# Patient Record
Sex: Female | Born: 1955
Health system: Southern US, Community
[De-identification: ages and names within clinical notes are randomized; demographics above are authoritative.]

## PROBLEM LIST (undated history)

## (undated) DIAGNOSIS — R87619 Unspecified abnormal cytological findings in specimens from cervix uteri: Secondary | ICD-10-CM

## (undated) DIAGNOSIS — A630 Anogenital (venereal) warts: Secondary | ICD-10-CM

## (undated) DIAGNOSIS — M419 Scoliosis, unspecified: Secondary | ICD-10-CM

## (undated) DIAGNOSIS — N200 Calculus of kidney: Secondary | ICD-10-CM

## (undated) DIAGNOSIS — H409 Unspecified glaucoma: Secondary | ICD-10-CM

## (undated) DIAGNOSIS — A64 Unspecified sexually transmitted disease: Secondary | ICD-10-CM

## (undated) DIAGNOSIS — K5790 Diverticulosis of intestine, part unspecified, without perforation or abscess without bleeding: Secondary | ICD-10-CM

## (undated) DIAGNOSIS — M810 Age-related osteoporosis without current pathological fracture: Secondary | ICD-10-CM

## (undated) DIAGNOSIS — H269 Unspecified cataract: Secondary | ICD-10-CM

## (undated) DIAGNOSIS — M8580 Other specified disorders of bone density and structure, unspecified site: Secondary | ICD-10-CM

## (undated) DIAGNOSIS — M1990 Unspecified osteoarthritis, unspecified site: Secondary | ICD-10-CM

## (undated) HISTORY — DX: Other specified disorders of bone density and structure, unspecified site: M85.80

## (undated) HISTORY — DX: Age-related osteoporosis without current pathological fracture: M81.0

## (undated) HISTORY — DX: Unspecified cataract: H26.9

## (undated) HISTORY — DX: Unspecified sexually transmitted disease: A64

## (undated) HISTORY — DX: Calculus of kidney: N20.0

## (undated) HISTORY — DX: Diverticulosis of intestine, part unspecified, without perforation or abscess without bleeding: K57.90

## (undated) HISTORY — DX: Unspecified abnormal cytological findings in specimens from cervix uteri: R87.619

## (undated) HISTORY — DX: Unspecified glaucoma: H40.9

## (undated) HISTORY — DX: Anogenital (venereal) warts: A63.0

## (undated) HISTORY — DX: Scoliosis, unspecified: M41.9

## (undated) HISTORY — DX: Unspecified osteoarthritis, unspecified site: M19.90

---

## 1982-07-01 DIAGNOSIS — A630 Anogenital (venereal) warts: Secondary | ICD-10-CM

## 1982-07-01 HISTORY — DX: Anogenital (venereal) warts: A63.0

## 1993-07-01 HISTORY — PX: PELVIC LAPAROSCOPY: SHX162

## 1995-07-02 HISTORY — PX: GYNECOLOGIC CRYOSURGERY: SHX857

## 2005-03-18 ENCOUNTER — Other Ambulatory Visit: Admission: RE | Admit: 2005-03-18 | Discharge: 2005-03-18 | Payer: Self-pay | Admitting: Obstetrics and Gynecology

## 2005-07-29 ENCOUNTER — Other Ambulatory Visit: Admission: RE | Admit: 2005-07-29 | Discharge: 2005-07-29 | Payer: Self-pay | Admitting: Obstetrics and Gynecology

## 2005-09-05 ENCOUNTER — Encounter: Admission: RE | Admit: 2005-09-05 | Discharge: 2005-09-05 | Payer: Self-pay | Admitting: Obstetrics and Gynecology

## 2006-02-12 ENCOUNTER — Other Ambulatory Visit: Admission: RE | Admit: 2006-02-12 | Discharge: 2006-02-12 | Payer: Self-pay | Admitting: Obstetrics and Gynecology

## 2006-09-08 ENCOUNTER — Encounter: Admission: RE | Admit: 2006-09-08 | Discharge: 2006-09-08 | Payer: Self-pay | Admitting: Obstetrics and Gynecology

## 2007-09-10 ENCOUNTER — Encounter: Admission: RE | Admit: 2007-09-10 | Discharge: 2007-09-10 | Payer: Self-pay | Admitting: Obstetrics and Gynecology

## 2007-12-28 ENCOUNTER — Encounter: Admission: RE | Admit: 2007-12-28 | Discharge: 2007-12-28 | Payer: Self-pay | Admitting: Obstetrics and Gynecology

## 2008-07-21 ENCOUNTER — Ambulatory Visit: Payer: Self-pay | Admitting: Sports Medicine

## 2008-07-21 DIAGNOSIS — IMO0002 Reserved for concepts with insufficient information to code with codable children: Secondary | ICD-10-CM | POA: Insufficient documentation

## 2008-07-21 DIAGNOSIS — M216X9 Other acquired deformities of unspecified foot: Secondary | ICD-10-CM | POA: Insufficient documentation

## 2008-07-21 DIAGNOSIS — M2170 Unequal limb length (acquired), unspecified site: Secondary | ICD-10-CM | POA: Insufficient documentation

## 2008-09-12 ENCOUNTER — Encounter: Admission: RE | Admit: 2008-09-12 | Discharge: 2008-09-12 | Payer: Self-pay | Admitting: Obstetrics and Gynecology

## 2009-09-13 ENCOUNTER — Encounter: Admission: RE | Admit: 2009-09-13 | Discharge: 2009-09-13 | Payer: Self-pay | Admitting: Obstetrics and Gynecology

## 2010-07-12 ENCOUNTER — Ambulatory Visit
Admission: RE | Admit: 2010-07-12 | Discharge: 2010-07-12 | Payer: Self-pay | Source: Home / Self Care | Attending: Sports Medicine | Admitting: Sports Medicine

## 2010-07-12 DIAGNOSIS — R269 Unspecified abnormalities of gait and mobility: Secondary | ICD-10-CM | POA: Insufficient documentation

## 2010-07-12 DIAGNOSIS — M7750 Other enthesopathy of unspecified foot: Secondary | ICD-10-CM | POA: Insufficient documentation

## 2010-08-02 NOTE — Assessment & Plan Note (Signed)
Summary: LT FOOT ARCH PAIN,MC   Vital Signs:  Patient profile:   55 year old female Height:      66 inches Weight:      140 pounds BMI:     22.68 Pulse rate:   60 / minute BP sitting:   115 / 79  (right arm)  Vitals Entered By: Rochele Pages, RN CC: L foot pain x 3 months- metatarsals   CC:  L foot pain x 3 months- metatarsals.  History of Present Illness: Pt presents to clinic for eval of Left foot pain- metatarsals plantar aspect x 3 months.  Better due to wearing supportive shoes such as danskos, worse with walking.   Has stopped running due to pain, now walking and riding recumbent bike for exercise.   Did well with previous intervention for shin splints, and leg length discrepency.  these were OTC sporst insoles with leg lift and MT pads these no longer control  her pain  Preventive Screening-Counseling & Management  Alcohol-Tobacco     Smoking Status: never  Allergies: No Known Drug Allergies  Social History: Smoking Status:  never  Physical Exam  General:  Well-developed,well-nourished,in no acute distress; alert,appropriate and cooperative throughout examination Msk:  Standing- widening of forefoot bilat Splaying between 2nd and 3rd toe on L.  Small bunionettes bilat Lt leg shorter than rt by 1.75 cm  gait shows trendelenburg to left and drop of shoulder good running form    Impression & Recommendations:  Problem # 1:  CAVUS DEFORMITY OF FOOT, ACQUIRED (ICD-736.73)  Patient was fitted for a : standard, cushioned, semi-rigid orthotic. The orthotic was heated and afterward the patient stood on the orthotic blank positioned on the orthotic stand. The patient was positioned in subtalar neutral position and 10 degrees of ankle dorsiflexion in a weight bearing stance. After completion of molding, a stable base was applied to the orthotic blank. The blank was ground to a stable position for weight bearing. Size: 9 Base: Blue Swirl Posting: Blue EVA, and Engineer, site on left for leg length discrepency Additional orthotic padding: bilateral medium metatarsal pads  Orders: Games developer, each unit ([id number])  Problem # 2:  ABNORMALITY OF GAIT (ICD-781.2)  with orthotic correction the trendelenburg  normalizes and shoulders are now level  Orders: Orthotic Materials, each unit (L3002)  Problem # 3:  UNEQUAL LEG LENGTH (ICD-736.81)  we added 2nd EVA lift ot left insole to correct this  use these as long as helping sxs  return prn  Orders: Orthotic Materials, each unit (L3002)  Problem # 4:  METATARSALGIA (ICD-726.70)  MT pads added to both inserts  Orders: Orthotic Materials, each unit [phone])  Patient Instructions: 1)  Chiropractors - Zenaida Deed 2)  Dameon Rudolfo   Orders Added: 1)  Est. Patient Level IV [03474] 2)  Orthotic Materials, each unit [L3002]

## 2010-08-14 ENCOUNTER — Other Ambulatory Visit: Payer: Self-pay | Admitting: Obstetrics and Gynecology

## 2010-08-14 DIAGNOSIS — Z1239 Encounter for other screening for malignant neoplasm of breast: Secondary | ICD-10-CM

## 2010-09-12 ENCOUNTER — Ambulatory Visit: Payer: Self-pay | Admitting: Sports Medicine

## 2010-09-17 ENCOUNTER — Ambulatory Visit
Admission: RE | Admit: 2010-09-17 | Discharge: 2010-09-17 | Disposition: A | Discharge status: Home / Self Care | Payer: Federal, State, Local not specified - PPO | Source: Ambulatory Visit | Attending: Obstetrics and Gynecology | Admitting: Obstetrics and Gynecology

## 2010-09-17 DIAGNOSIS — Z1239 Encounter for other screening for malignant neoplasm of breast: Secondary | ICD-10-CM

## 2011-08-22 ENCOUNTER — Other Ambulatory Visit: Payer: Self-pay | Admitting: Obstetrics and Gynecology

## 2011-08-22 DIAGNOSIS — Z1231 Encounter for screening mammogram for malignant neoplasm of breast: Secondary | ICD-10-CM

## 2011-09-03 ENCOUNTER — Ambulatory Visit (INDEPENDENT_AMBULATORY_CARE_PROVIDER_SITE_OTHER): Payer: Federal, State, Local not specified - PPO | Admitting: Obstetrics and Gynecology

## 2011-09-03 DIAGNOSIS — K5901 Slow transit constipation: Secondary | ICD-10-CM

## 2011-09-03 DIAGNOSIS — Z01419 Encounter for gynecological examination (general) (routine) without abnormal findings: Secondary | ICD-10-CM

## 2011-09-18 ENCOUNTER — Ambulatory Visit
Admission: RE | Admit: 2011-09-18 | Discharge: 2011-09-18 | Disposition: A | Discharge status: Home / Self Care | Payer: Federal, State, Local not specified - PPO | Source: Ambulatory Visit | Attending: Obstetrics and Gynecology | Admitting: Obstetrics and Gynecology

## 2011-09-18 DIAGNOSIS — Z1231 Encounter for screening mammogram for malignant neoplasm of breast: Secondary | ICD-10-CM

## 2011-09-23 ENCOUNTER — Encounter (INDEPENDENT_AMBULATORY_CARE_PROVIDER_SITE_OTHER): Payer: Federal, State, Local not specified - PPO | Admitting: Obstetrics and Gynecology

## 2011-09-23 ENCOUNTER — Encounter (INDEPENDENT_AMBULATORY_CARE_PROVIDER_SITE_OTHER): Payer: Federal, State, Local not specified - PPO

## 2011-09-23 ENCOUNTER — Other Ambulatory Visit: Payer: Self-pay | Admitting: Obstetrics and Gynecology

## 2011-09-23 DIAGNOSIS — N949 Unspecified condition associated with female genital organs and menstrual cycle: Secondary | ICD-10-CM

## 2011-09-23 DIAGNOSIS — R102 Pelvic and perineal pain: Secondary | ICD-10-CM

## 2011-09-26 ENCOUNTER — Ambulatory Visit
Admission: RE | Admit: 2011-09-26 | Discharge: 2011-09-26 | Disposition: A | Discharge status: Home / Self Care | Payer: Federal, State, Local not specified - PPO | Source: Ambulatory Visit | Attending: Obstetrics and Gynecology | Admitting: Obstetrics and Gynecology

## 2011-09-26 DIAGNOSIS — R102 Pelvic and perineal pain: Secondary | ICD-10-CM

## 2011-09-26 MED ORDER — GADOBENATE DIMEGLUMINE 529 MG/ML IV SOLN
13.0000 mL | Freq: Once | INTRAVENOUS | Status: AC | PRN
  Administered 2011-09-26: 13 mL via INTRAVENOUS

## 2011-09-30 ENCOUNTER — Encounter (INDEPENDENT_AMBULATORY_CARE_PROVIDER_SITE_OTHER): Payer: Federal, State, Local not specified - PPO | Admitting: Obstetrics and Gynecology

## 2011-09-30 DIAGNOSIS — N949 Unspecified condition associated with female genital organs and menstrual cycle: Secondary | ICD-10-CM

## 2012-08-21 ENCOUNTER — Other Ambulatory Visit: Payer: Self-pay | Admitting: Obstetrics and Gynecology

## 2012-08-21 DIAGNOSIS — Z1231 Encounter for screening mammogram for malignant neoplasm of breast: Secondary | ICD-10-CM

## 2012-09-21 ENCOUNTER — Ambulatory Visit: Payer: Federal, State, Local not specified - PPO

## 2012-11-25 ENCOUNTER — Ambulatory Visit
Admission: RE | Admit: 2012-11-25 | Discharge: 2012-11-25 | Disposition: A | Discharge status: Home / Self Care | Payer: Federal, State, Local not specified - PPO | Source: Ambulatory Visit | Attending: Obstetrics and Gynecology | Admitting: Obstetrics and Gynecology

## 2012-11-25 DIAGNOSIS — Z1231 Encounter for screening mammogram for malignant neoplasm of breast: Secondary | ICD-10-CM

## 2013-07-01 HISTORY — PX: MANDIBLE SURGERY: SHX707

## 2013-10-25 ENCOUNTER — Other Ambulatory Visit: Payer: Self-pay

## 2013-10-25 DIAGNOSIS — Z1231 Encounter for screening mammogram for malignant neoplasm of breast: Secondary | ICD-10-CM

## 2013-11-29 ENCOUNTER — Ambulatory Visit
Admission: RE | Admit: 2013-11-29 | Discharge: 2013-11-29 | Disposition: A | Discharge status: Home / Self Care | Payer: Federal, State, Local not specified - PPO | Source: Ambulatory Visit

## 2013-11-29 ENCOUNTER — Encounter (INDEPENDENT_AMBULATORY_CARE_PROVIDER_SITE_OTHER): Payer: Self-pay

## 2013-11-29 DIAGNOSIS — Z1231 Encounter for screening mammogram for malignant neoplasm of breast: Secondary | ICD-10-CM

## 2013-11-30 ENCOUNTER — Other Ambulatory Visit: Payer: Self-pay | Admitting: Obstetrics and Gynecology

## 2013-11-30 DIAGNOSIS — R928 Other abnormal and inconclusive findings on diagnostic imaging of breast: Secondary | ICD-10-CM

## 2013-12-06 ENCOUNTER — Ambulatory Visit
Admission: RE | Admit: 2013-12-06 | Discharge: 2013-12-06 | Disposition: A | Discharge status: Home / Self Care | Payer: Federal, State, Local not specified - PPO | Source: Ambulatory Visit | Attending: Obstetrics and Gynecology | Admitting: Obstetrics and Gynecology

## 2013-12-06 ENCOUNTER — Other Ambulatory Visit: Payer: Self-pay | Admitting: Obstetrics and Gynecology

## 2013-12-06 DIAGNOSIS — R928 Other abnormal and inconclusive findings on diagnostic imaging of breast: Secondary | ICD-10-CM

## 2014-09-02 ENCOUNTER — Other Ambulatory Visit: Payer: Self-pay | Admitting: Obstetrics and Gynecology

## 2014-09-02 DIAGNOSIS — N644 Mastodynia: Secondary | ICD-10-CM

## 2014-09-05 ENCOUNTER — Ambulatory Visit
Admission: RE | Admit: 2014-09-05 | Discharge: 2014-09-05 | Disposition: A | Discharge status: Home / Self Care | Payer: Federal, State, Local not specified - PPO | Source: Ambulatory Visit | Attending: Obstetrics and Gynecology | Admitting: Obstetrics and Gynecology

## 2014-09-05 ENCOUNTER — Other Ambulatory Visit: Payer: Self-pay | Admitting: Obstetrics and Gynecology

## 2014-09-05 DIAGNOSIS — N644 Mastodynia: Secondary | ICD-10-CM

## 2014-09-05 DIAGNOSIS — R0789 Other chest pain: Secondary | ICD-10-CM

## 2014-11-10 ENCOUNTER — Other Ambulatory Visit: Payer: Self-pay

## 2014-11-10 DIAGNOSIS — Z1231 Encounter for screening mammogram for malignant neoplasm of breast: Secondary | ICD-10-CM

## 2014-12-21 ENCOUNTER — Ambulatory Visit
Admission: RE | Admit: 2014-12-21 | Discharge: 2014-12-21 | Disposition: A | Discharge status: Home / Self Care | Payer: Federal, State, Local not specified - PPO | Source: Ambulatory Visit

## 2014-12-21 DIAGNOSIS — Z1231 Encounter for screening mammogram for malignant neoplasm of breast: Secondary | ICD-10-CM

## 2015-02-06 ENCOUNTER — Encounter: Payer: Self-pay | Admitting: Obstetrics and Gynecology

## 2015-02-06 ENCOUNTER — Ambulatory Visit (INDEPENDENT_AMBULATORY_CARE_PROVIDER_SITE_OTHER): Payer: Federal, State, Local not specified - PPO | Admitting: Obstetrics and Gynecology

## 2015-02-06 VITALS — BP 110/78 | HR 60 | Resp 16 | Ht 65.5 in | Wt 149.0 lb

## 2015-02-06 DIAGNOSIS — Z01419 Encounter for gynecological examination (general) (routine) without abnormal findings: Secondary | ICD-10-CM

## 2015-02-06 DIAGNOSIS — Z0000 Encounter for general adult medical examination without abnormal findings: Secondary | ICD-10-CM

## 2015-02-06 LAB — POCT URINALYSIS DIPSTICK
BILIRUBIN UA: NEGATIVE
Glucose, UA: NEGATIVE
Ketones, UA: NEGATIVE
LEUKOCYTES UA: NEGATIVE
Nitrite, UA: NEGATIVE
PROTEIN UA: NEGATIVE
RBC UA: NEGATIVE
Urobilinogen, UA: NEGATIVE
pH, UA: 5

## 2015-02-06 NOTE — Patient Instructions (Signed)

## 2015-02-06 NOTE — Progress Notes (Signed)
Patient ID: Brenda Dyer, female   DOB: Dec 14, 1955, 59 y.o.   MRN: [id number] 59 y.o. G74P1001 Married Caucasian female here for annual exam.    Decreased libido.  OK with this.   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 57 year old son.  PCP:   Darcus Austin, MD  No LMP recorded. Patient is postmenopausal.          Sexually active: Yes.  female  The current method of family planning is post menopausal status.   LMP was about 2009.  Exercising: Yes.    walking. Smoker:  no  Health Maintenance: Pap:  11/2013 normal per pt. History of abnormal Pap:  Yes,Hx of colposcopy/cryotherapy to cervix 1997 in [place].  Pt. Also states hx of dysplastic paps from 2006-2009 with colposcopies but no treatment to cervix.  States history of positive HR HPV. MMG:  12-21-14 Density Cat.D/neg:[facility] Colonoscopy:  2009 normal with Dr. Collene Mares.  Next due 2019. BMD:   About 5 years ago - Normal. TDaP:  Up to date with PCP Screening Labs:  Hb today: PCP, Urine today: Neg   reports that she has never smoked. She does not have any smokeless tobacco history on file. She reports that she drinks about 2.4 oz of alcohol per week. She reports that she does not use illicit drugs.  Past Medical History  Diagnosis Date  . Genital warts 1984  . STD (sexually transmitted disease)     Hx condyloma  . Abnormal Pap smear of cervix 1997, 2006-2009    --1997 pt. had colpo/cryotherapy to cervix in [place], [place], 2006-2009 abn.paps(dysplasia) and colposcopies but no treatment to cervix--paps returned to normal    Past Surgical History  Procedure Laterality Date  . Pelvic laparoscopy  1995    d/t back pain--nl--in [place], [place]  . Gynecologic cryosurgery  1997    done in [place], [place]  . Cesarean section  1991    Current Outpatient Prescriptions  Medication Sig Dispense Refill  . hyoscyamine (LEVSIN SL) 0.125 MG SL tablet Take 1 tablet by mouth as needed.     No  current facility-administered medications for this visit.    Family History  Problem Relation Age of Onset  . Heart attack Father   . Diabetes Father   . Other Father     vascular/cluster H/As  . Stroke Father   . Hyperlipidemia Brother     ROS:  Pertinent items are noted in HPI.  Otherwise, a comprehensive ROS was negative.  Exam:   BP 110/78 mmHg  Pulse 60  Resp 16  Ht 5' 5.5" (1.664 m)  Wt 149 lb (67.586 kg)  BMI 24.41 kg/m2    General appearance: alert, cooperative and appears stated age Head: Normocephalic, without obvious abnormality, atraumatic Neck: no adenopathy, supple, symmetrical, trachea midline and thyroid normal to inspection and palpation Lungs: clear to auscultation bilaterally Breasts: normal appearance, no masses or tenderness, Inspection negative, No nipple retraction or dimpling, No nipple discharge or bleeding, No axillary or supraclavicular adenopathy Heart: regular rate and rhythm Abdomen: soft, non-tender; bowel sounds normal; no masses,  no organomegaly Extremities: extremities normal, atraumatic, no cyanosis or edema Skin: Skin color, texture, turgor normal. No rashes or lesions Lymph nodes: Cervical, supraclavicular, and axillary nodes normal. No abnormal inguinal nodes palpated Neurologic: Grossly normal  Pelvic: External genitalia:  no lesions              Urethra:  normal appearing urethra  with no masses, tenderness or lesions              Bartholins and Skenes: normal                 Vagina: normal appearing vagina with normal color and discharge, no lesions              Cervix: no lesions and bleeds slightly with pap.              Pap taken: Yes.   Bimanual Exam:  Uterus:  normal size, contour, position, consistency, mobility, non-tender              Adnexa: normal adnexa and no mass, fullness, tenderness              Rectovaginal: Yes.  .  Confirms.              Anus:  normal sphincter tone, no lesions  Chaperone was present for  exam.  Assessment:   Well woman visit with normal exam. Remote hx of abnormal pap, condyloma, positive HR HPV and cryotherapy. Hx C/S.  Plan: Yearly mammogram recommended after age 59.  Recommended 3D. Recommended self breast exam.  Pap and HR HPV as above. Discussed Calcium, Vitamin D, regular exercise program including cardiovascular and weight bearing exercise. Labs performed.  No..    Refills given on medications.  No..    Follow up annually and prn.      After visit summary provided.

## 2015-02-09 LAB — IPS PAP TEST WITH HPV

## 2015-11-03 ENCOUNTER — Other Ambulatory Visit: Payer: Self-pay

## 2015-11-13 ENCOUNTER — Other Ambulatory Visit: Payer: Self-pay

## 2015-11-13 DIAGNOSIS — Z1231 Encounter for screening mammogram for malignant neoplasm of breast: Secondary | ICD-10-CM

## 2015-12-22 ENCOUNTER — Ambulatory Visit: Payer: Federal, State, Local not specified - PPO

## 2015-12-26 ENCOUNTER — Ambulatory Visit
Admission: RE | Admit: 2015-12-26 | Discharge: 2015-12-26 | Disposition: A | Discharge status: Home / Self Care | Payer: Federal, State, Local not specified - PPO | Source: Ambulatory Visit

## 2015-12-26 DIAGNOSIS — Z1231 Encounter for screening mammogram for malignant neoplasm of breast: Secondary | ICD-10-CM

## 2016-02-19 NOTE — Progress Notes (Signed)
60 y.o. G58P1001 Married Caucasian female here for annual exam.    Left breast/chest wall sensation doe 2 years. Has had normal diagnostic mammograms.  Pain does not radiate into her back.  It can radiate into her left arm and up into her jaw.  Had a normal EKG when the pain started first occurring.  Hx scoliosis.  Has seen a spine specialist.   Wants reassurance that it is OK.  Went on  [place] cruise. Retiring in a few months.   Labs with PCP - al normal.  PCP:   Darcus Austin, MD  Patient's last menstrual period was 07/02/2007 (approximate).           Sexually active: Yes.   female The current method of family planning is post menopausal status.    Exercising: Yes.    Walking and strength conditioning Smoker:  no  Health Maintenance: Pap:  02-06-15 Neg:Neg HR HPV History of abnormal Pap:  Yes, Hx of colposcopy/cryotherapy to cervix 1997 in [place].  Pt. Also states hx of dysplastic paps from 2006-2009 with colposcopies but no treatment to cervix.  States history of positive HR HPV. MMG: 12-29-15 Density C/Stable benign mass Rt.Br.12 o'clock--benign cyst demonstrated on U/S 12-06-13;Lt.Br.neg/screening 45yr/[facility] Colonoscopy:  2009 normal with Dr. Lenise Herald due 2019. BMD:   2011  Result:  normal TDaP:  PCP Gardasil:   N/A HIV:  Not done in past. Hep C:  Not done in past. Screening Labs:  Hb today: PCP, Urine today: Neg   reports that she has never smoked. She has never used smokeless tobacco. She reports that she drinks about 1.2 oz of alcohol per week . She reports that she does not use drugs.  Past Medical History:  Diagnosis Date  . Abnormal Pap smear of cervix 1997, 2006-2009   --1997 pt. had colpo/cryotherapy to cervix in [place], [place], 2006-2009 abn.paps(dysplasia) and colposcopies but no treatment to cervix--paps returned to normal  . Genital warts 1984  . STD (sexually transmitted disease)    Hx condyloma    Past Surgical History:  Procedure  Laterality Date  . CESAREAN SECTION  1991  . GYNECOLOGIC CRYOSURGERY  1997   done in [place], [place]   d/t back pain--nl--in [place], [place]    No current outpatient prescriptions on file.   No current facility-administered medications for this visit.     Family History  Problem Relation Age of Onset  . Heart attack Father   . Diabetes Father   . Other Father     vascular/cluster H/As  . Stroke Father   . Hyperlipidemia Brother     ROS:  Pertinent items are noted in HPI.  Otherwise, a comprehensive ROS was negative.  Exam:   BP 118/70 (BP Location: Right Arm, Patient Position: Sitting, Cuff Size: Normal)   Pulse 66   Resp 14   Ht 5' 5.5" (1.664 m)   Wt 139 lb (63 kg)   LMP 07/02/2007 (Approximate)   BMI 22.78 kg/m     General appearance: alert, cooperative and appears stated age Head: Normocephalic, without obvious abnormality, atraumatic Neck: no adenopathy, supple, symmetrical, trachea midline and thyroid normal to inspection and palpation Lungs: clear to auscultation bilaterally Breasts: normal appearance, no masses or tenderness, No nipple retraction or dimpling, No nipple discharge or bleeding, No axillary or supraclavicular adenopathy Heart: regular rate and rhythm Abdomen: soft, non-tender; no masses, no organomegaly Extremities: extremities normal, atraumatic, no cyanosis or edema Skin: Skin color, texture,  turgor normal. No rashes or lesions Lymph nodes: Cervical, supraclavicular, and axillary nodes normal. No abnormal inguinal nodes palpated Neurologic: Grossly normal  Pelvic: External genitalia:  no lesions              Urethra:  normal appearing urethra with no masses, tenderness or lesions              Bartholins and Skenes: normal                 Vagina: normal appearing vagina with normal color and discharge, no lesions              Cervix: no lesions              Pap taken: No. Bimanual Exam:  Uterus:  normal size, contour,  position, consistency, mobility, non-tender              Adnexa: no mass, fullness, tenderness              Rectal exam: Yes.  .  Confirms.              Anus:  normal sphincter tone, no lesions  Chaperone was present for exam.  Assessment:   Well woman visit with normal exam. Remote hx of abnormal pap, condyloma, positive HR HPV and cryotherapy. Hx C/S. Chest wall pain.   Plan: Yearly mammogram recommended after age 68.  Recommended self breast exam.  Pap and HR HPV as above. Discussed Calcium, Vitamin D, regular exercise program including cardiovascular and weight bearing exercise. I recommend patient return to her PCP to discuss her chest wall pain.  Consider CT of chest.  Will return for Hep C and HIV testing.  Follow up annually and prn.       After visit summary provided.

## 2016-02-21 ENCOUNTER — Ambulatory Visit (INDEPENDENT_AMBULATORY_CARE_PROVIDER_SITE_OTHER): Payer: Federal, State, Local not specified - PPO | Admitting: Obstetrics and Gynecology

## 2016-02-21 ENCOUNTER — Encounter: Payer: Self-pay | Admitting: Obstetrics and Gynecology

## 2016-02-21 VITALS — BP 118/70 | HR 66 | Resp 14 | Ht 65.5 in | Wt 139.0 lb

## 2016-02-21 DIAGNOSIS — Z0000 Encounter for general adult medical examination without abnormal findings: Secondary | ICD-10-CM | POA: Diagnosis not present

## 2016-02-21 DIAGNOSIS — Z113 Encounter for screening for infections with a predominantly sexual mode of transmission: Secondary | ICD-10-CM | POA: Diagnosis not present

## 2016-02-21 DIAGNOSIS — Z01419 Encounter for gynecological examination (general) (routine) without abnormal findings: Secondary | ICD-10-CM | POA: Diagnosis not present

## 2016-02-21 LAB — POCT URINALYSIS DIPSTICK
BILIRUBIN UA: NEGATIVE
Blood, UA: NEGATIVE
GLUCOSE UA: NEGATIVE
Ketones, UA: NEGATIVE
LEUKOCYTES UA: NEGATIVE
Nitrite, UA: NEGATIVE
Protein, UA: NEGATIVE
Urobilinogen, UA: NEGATIVE
pH, UA: 5

## 2016-02-21 NOTE — Patient Instructions (Signed)

## 2016-02-22 ENCOUNTER — Other Ambulatory Visit (INDEPENDENT_AMBULATORY_CARE_PROVIDER_SITE_OTHER): Payer: Federal, State, Local not specified - PPO

## 2016-02-22 DIAGNOSIS — Z113 Encounter for screening for infections with a predominantly sexual mode of transmission: Secondary | ICD-10-CM

## 2016-02-23 LAB — HEPATITIS C ANTIBODY: HCV Ab: NEGATIVE

## 2016-02-23 LAB — HIV ANTIBODY (ROUTINE TESTING W REFLEX): HIV 1&2 Ab, 4th Generation: NONREACTIVE

## 2016-04-10 ENCOUNTER — Telehealth: Payer: Self-pay | Admitting: Obstetrics and Gynecology

## 2016-04-10 DIAGNOSIS — N644 Mastodynia: Secondary | ICD-10-CM

## 2016-04-10 NOTE — Telephone Encounter (Signed)
Spoke with patient. Patient requesting referral for diagnostic MMG of left breast. Patient states she has had ongoing left breast pain in "upper quadrant". Denies tenderness, discharge, redness. Patient states she discussed this at length with Dr. Quincy Simmonds at [facility] 02/21/16. Patient states now she would like the MMG done for piece of mind. Recommended patient come in for OV for further evaluation, patient declined. Patient states she was just in and could have had it done then. Advised I would review with Dr. Quincy Simmonds and return call with recommendations. Advised Dr. Quincy Simmonds is seeing patients, repsonse may not be immediate. Patient is agreeable. Last screening MMG 12/29/15.    Dr Quincy Simmonds, please advise?

## 2016-04-10 NOTE — Telephone Encounter (Signed)
Left detailed message, ok per current dpr. Advised as seen below per Dr. Quincy Simmonds. Order placed for MMG and [place]. Advised patient to call and schedule at [facility] [phone]. Call office for any additional questions/concerns.    Routing to provider for final review. Patient is agreeable to disposition. Will close encounter.

## 2016-04-10 NOTE — Telephone Encounter (Signed)
We did discuss her breast/chest wall pain at her visit.  OK for diagnostic left mammogram and ultrasound.   I really do recommend she see her PCP to discuss this pain and have them determine if she needs to do a CT of the chest also.   Thanks.

## 2016-04-10 NOTE — Telephone Encounter (Signed)
[facility] of the left breast.

## 2016-04-22 ENCOUNTER — Other Ambulatory Visit: Payer: Federal, State, Local not specified - PPO

## 2016-04-23 ENCOUNTER — Ambulatory Visit
Admission: RE | Admit: 2016-04-23 | Discharge: 2016-04-23 | Disposition: A | Discharge status: Home / Self Care | Payer: Federal, State, Local not specified - PPO | Source: Ambulatory Visit | Attending: Obstetrics and Gynecology | Admitting: Obstetrics and Gynecology

## 2016-04-23 DIAGNOSIS — N644 Mastodynia: Secondary | ICD-10-CM

## 2016-11-11 ENCOUNTER — Other Ambulatory Visit: Payer: Self-pay | Admitting: Obstetrics and Gynecology

## 2016-11-11 DIAGNOSIS — Z1231 Encounter for screening mammogram for malignant neoplasm of breast: Secondary | ICD-10-CM

## 2016-12-26 ENCOUNTER — Ambulatory Visit
Admission: RE | Admit: 2016-12-26 | Discharge: 2016-12-26 | Disposition: A | Discharge status: Home / Self Care | Payer: Federal, State, Local not specified - PPO | Source: Ambulatory Visit | Attending: Obstetrics and Gynecology | Admitting: Obstetrics and Gynecology

## 2016-12-26 DIAGNOSIS — Z1231 Encounter for screening mammogram for malignant neoplasm of breast: Secondary | ICD-10-CM

## 2017-02-28 ENCOUNTER — Ambulatory Visit (INDEPENDENT_AMBULATORY_CARE_PROVIDER_SITE_OTHER): Payer: Federal, State, Local not specified - PPO | Admitting: Obstetrics and Gynecology

## 2017-02-28 ENCOUNTER — Encounter: Payer: Self-pay | Admitting: Obstetrics and Gynecology

## 2017-02-28 ENCOUNTER — Other Ambulatory Visit (HOSPITAL_COMMUNITY)
Admission: RE | Admit: 2017-02-28 | Discharge: 2017-02-28 | Disposition: A | Discharge status: Home / Self Care | Payer: Federal, State, Local not specified - PPO | Source: Ambulatory Visit | Attending: Obstetrics and Gynecology | Admitting: Obstetrics and Gynecology

## 2017-02-28 VITALS — BP 102/60 | HR 64 | Resp 16 | Ht 65.25 in | Wt 144.0 lb

## 2017-02-28 DIAGNOSIS — Z01419 Encounter for gynecological examination (general) (routine) without abnormal findings: Secondary | ICD-10-CM | POA: Insufficient documentation

## 2017-02-28 NOTE — Progress Notes (Signed)
61 y.o. G83P1001 Married Caucasian female here for annual exam.    No bleeding or spotting.   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   Retired in December.   Exercising every day.  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  PCP:  Dr. Inda Merlin [facility] - sees another provider but cannot remember the person's name.  Patient's last menstrual period was 07/02/2007 (approximate).           Sexually active: No.  The current method of family planning is post menopausal status.    Exercising: Yes.    yoga and cardio Smoker:  no   Health Maintenance: Pap:  02-06-15 Neg:Neg HR HPV History of abnormal Pap:  Yes, Hx of colposcopy/cryotherapy to cervix 1997 in [place]. Pt. Also states hx of dysplastic paps from 2006-2009 with colposcopies but no treatment to cervix. States history of positive HR HPV. MMG:  12/26/16 BIRADS 1 negative/density c Colonoscopy:  2009 normal with Dr. Lenise Herald due 2019 BMD:   2011  Result  normal TDaP: up to date with PCP Gardasil:   n/a HIV and Hep C: 02/22/16 Negative Screening Labs:  PCP takes care of labs   reports that she has never smoked. She has never used smokeless tobacco. She reports that she drinks about 1.2 oz of alcohol per week . She reports that she does not use drugs.  Past Medical History:  Diagnosis Date  . Abnormal Pap smear of cervix 1997, 2006-2009   --1997 pt. had colpo/cryotherapy to cervix in [place], [place], 2006-2009 abn.paps(dysplasia) and colposcopies but no treatment to cervix--paps returned to normal  . Genital warts 1984  . STD (sexually transmitted disease)    Hx condyloma    Past Surgical History:  Procedure Laterality Date  . BREAST BIOPSY Left   . CESAREAN SECTION  1991  . GYNECOLOGIC CRYOSURGERY  1997   done in [place], [place]   d/t back pain--nl--in [place], [place]    Current Outpatient Prescriptions  Medication Sig Dispense Refill  . Cholecalciferol (VITAMIN D)  2000 units CAPS daily.    Mariane Baumgarten Calcium (STOOL SOFTENER PO) Take by mouth.    Marland Kitchen olopatadine (PATANOL) 0.1 % ophthalmic solution as needed.     No current facility-administered medications for this visit.     Family History  Problem Relation Age of Onset  . Heart attack Father   . Diabetes Father   . Other Father        vascular/cluster H/As  . Stroke Father   . Hyperlipidemia Brother   . Breast cancer Neg Hx     ROS:  Pertinent items are noted in HPI.  Otherwise, a comprehensive ROS was negative.  Exam:   BP 102/60 (BP Location: Right Arm, Patient Position: Sitting, Cuff Size: Normal)   Pulse 64   Resp 16   Ht 5' 5.25" (1.657 m)   Wt 144 lb (65.3 kg)   LMP 07/02/2007 (Approximate)   BMI 23.78 kg/m     General appearance: alert, cooperative and appears stated age Head: Normocephalic, without obvious abnormality, atraumatic Neck: no adenopathy, supple, symmetrical, trachea midline and thyroid normal to inspection and palpation Lungs: clear to auscultation bilaterally Breasts: normal appearance, no masses or tenderness, No nipple retraction or dimpling, No nipple discharge or bleeding, No axillary or supraclavicular adenopathy Heart: regular rate and rhythm Abdomen: soft, non-tender; no masses, no organomegaly Extremities: extremities normal, atraumatic, no cyanosis or edema Skin: Skin color,  texture, turgor normal. No rashes or lesions Lymph nodes: supraclavicular, and axillary nodes normal.  1 cm right posterior cervical node, nontender. No abnormal inguinal nodes palpated Neurologic: Grossly normal  Pelvic: External genitalia:  no lesions              Urethra:  normal appearing urethra with no masses, tenderness or lesions              Bartholins and Skenes: normal                 Vagina: normal appearing vagina with normal color and discharge, no lesions              Cervix:  Small nabothian cyst 3 mm in central cervical area?              Pap taken: Yes.    Bimanual Exam:  Uterus:  normal size, contour, position, consistency, mobility, non-tender              Adnexa: no mass, fullness, tenderness              Rectal exam: Yes.  .  Confirms.              Anus:  normal sphincter tone, no lesions  Chaperone was present for exam.  Assessment:   Well woman visit with normal exam. Possible nabothian cyst of the cervix.  Remote hx of abnormal pap.  Palpable right cervical node.   Plan: Mammogram screening discussed. Recommended self breast awareness. Pap and HR HPV as above. Guidelines for Calcium, Vitamin D, regular exercise program including cardiovascular and weight bearing exercise. Patient will monitor node and call back if it increases or notes other enlarged nodes.   Follow up annually and prn.   After visit summary provided.

## 2017-02-28 NOTE — Patient Instructions (Signed)

## 2017-03-06 LAB — CYTOLOGY - PAP
Diagnosis: NEGATIVE
HPV: NOT DETECTED

## 2017-07-01 DIAGNOSIS — Z87442 Personal history of urinary calculi: Secondary | ICD-10-CM

## 2017-07-01 HISTORY — DX: Personal history of urinary calculi: Z87.442

## 2017-08-25 ENCOUNTER — Ambulatory Visit (INDEPENDENT_AMBULATORY_CARE_PROVIDER_SITE_OTHER): Payer: Federal, State, Local not specified - PPO | Admitting: Orthopaedic Surgery

## 2017-08-25 ENCOUNTER — Ambulatory Visit (INDEPENDENT_AMBULATORY_CARE_PROVIDER_SITE_OTHER): Payer: Federal, State, Local not specified - PPO

## 2017-08-25 ENCOUNTER — Encounter (INDEPENDENT_AMBULATORY_CARE_PROVIDER_SITE_OTHER): Payer: Self-pay | Admitting: Orthopaedic Surgery

## 2017-08-25 DIAGNOSIS — M25562 Pain in left knee: Secondary | ICD-10-CM | POA: Diagnosis not present

## 2017-08-25 DIAGNOSIS — M25561 Pain in right knee: Secondary | ICD-10-CM | POA: Diagnosis not present

## 2017-08-25 MED ORDER — DICLOFENAC SODIUM 1 % TD GEL: 2.0000 g | Freq: Four times a day (QID) | TRANSDERMAL | 5 refills | Status: AC

## 2017-08-25 NOTE — Progress Notes (Signed)
Office Visit Note   Patient: Brenda Dyer           Date of Birth: 02-05-1956           MRN: [id number] Visit Date: 08/25/2017              Requested by: Darcus Austin, MD [address], [place] [address] PCP: Darcus Austin, MD   Assessment & Plan: Visit Diagnoses:  1. Acute pain of both knees     Plan: Impression is knee peds anserine bursitis.  This is overall improving.  I recommend continued rest and ice and heat as needed.  Oral NSAIDs as needed.  Prescription for Voltaren gel was provided today.  Patient is decided going to.  The other possibility is stress reaction of the medial tibial plateau but overall she is doing okay and I did not feel compelled to order advanced imaging at this point.  She also does not have joint effusion.  Follow-Up Instructions: Return if symptoms worsen or fail to improve.   Orders:  Orders Placed This Encounter  Procedures  . XR KNEE 3 VIEW LEFT  . XR KNEE 3 VIEW RIGHT   Meds ordered this encounter  Medications  . diclofenac sodium (VOLTAREN) 1 % GEL    Sig: Apply 2 g topically 4 (four) times daily.    Dispense:  1 Tube    Refill:  5      Procedures: No procedures performed   Clinical Data: No additional findings.   Subjective: Chief Complaint  Patient presents with  . Left Knee - Pain  . Right Knee - Pain    Patient is a 62 year old female who recently began program about a months ago and then had onset of bilateral medial knee pain shortly thereafter.  She denies any injuries.  The pain has progressively gotten better over the last 2-3 weeks.  She is not done any running for about 2 weeks.  Denies any numbness and tingling.  Denies any mechanical symptoms.  She does endorse swelling over the peds anserine bursa.  Denies any joint swelling.    Review of Systems  Constitutional: Negative.   HENT: Negative.   Eyes: Negative.   Respiratory: Negative.   Cardiovascular: Negative.   Endocrine:  Negative.   Musculoskeletal: Negative.   Neurological: Negative.   Hematological: Negative.   Psychiatric/Behavioral: Negative.   All other systems reviewed and are negative.    Objective: Vital Signs: LMP 07/02/2007 (Approximate)   Physical Exam  Constitutional: She is oriented to person, place, and time. She appears well-developed and well-nourished.  Pulmonary/Chest: Effort normal.  Neurological: She is alert and oriented to person, place, and time.  Skin: Skin is warm. Capillary refill takes less than 2 seconds.  Psychiatric: She has a normal mood and affect. Her behavior is normal. Judgment and thought content normal.  Nursing note and vitals reviewed.   Ortho Exam Bilateral knee exam shows no joint effusion.  Collaterals and cruciates are stable.  No joint line tenderness.  She is tender over the peds anserine bursa. Specialty Comments:  No specialty comments available.  Imaging: Xr Knee 3 View Left  Result Date: 08/25/2017 No acute or structural abnormalities  Xr Knee 3 View Right  Result Date: 08/25/2017 No acute or structural abnormalities    PMFS History: Patient Active Problem List   Diagnosis Date Noted  . METATARSALGIA 07/12/2010  . ABNORMALITY OF GAIT 07/12/2010  . 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  UNEQUAL LEG LENGTH 07/21/2008  . SHIN SPLINTS 07/21/2008   Past Medical History:  Diagnosis Date  . Abnormal Pap smear of cervix 1997, 2006-2009   --1997 pt. had colpo/cryotherapy to cervix in [place], [place], 2006-2009 abn.paps(dysplasia) and colposcopies but no treatment to cervix--paps returned to normal  . Genital warts 1984  . STD (sexually transmitted disease)    Hx condyloma    Family History  Problem Relation Age of Onset  . Heart attack Father   . Diabetes Father   . Other Father        vascular/cluster H/As  . Stroke Father   . Hyperlipidemia Brother   . Breast cancer Neg Hx     Past Surgical History:  Procedure Laterality  Date  . BREAST BIOPSY Left   . CESAREAN SECTION  1991  . GYNECOLOGIC CRYOSURGERY  1997   done in [place], [place]   d/t back pain--nl--in [place], [place] History   Occupational History  . Not on file  Tobacco Use  . Smoking status: Never Smoker  . Smokeless tobacco: Never Used  Substance and Sexual Activity  . Alcohol use: Yes    Alcohol/week: 1.2 oz    Types: 2 Standard drinks or equivalent per week  . Drug use: No  . Sexual activity: Not Currently    Partners: Male    Birth control/protection: Post-menopausal

## 2017-10-04 ENCOUNTER — Emergency Department (HOSPITAL_COMMUNITY)
Admission: EM | Admit: 2017-10-04 | Discharge: 2017-10-05 | Disposition: A | Discharge status: Home / Self Care | Payer: Federal, State, Local not specified - PPO | Attending: Emergency Medicine | Admitting: Emergency Medicine

## 2017-10-04 ENCOUNTER — Other Ambulatory Visit: Payer: Self-pay

## 2017-10-04 ENCOUNTER — Encounter (HOSPITAL_COMMUNITY): Payer: Self-pay

## 2017-10-04 ENCOUNTER — Emergency Department (HOSPITAL_COMMUNITY): Payer: Federal, State, Local not specified - PPO

## 2017-10-04 DIAGNOSIS — R109 Unspecified abdominal pain: Secondary | ICD-10-CM

## 2017-10-04 DIAGNOSIS — R1031 Right lower quadrant pain: Secondary | ICD-10-CM | POA: Diagnosis not present

## 2017-10-04 DIAGNOSIS — R1030 Lower abdominal pain, unspecified: Secondary | ICD-10-CM | POA: Diagnosis present

## 2017-10-04 LAB — I-STAT CHEM 8, ED
BUN: 18 mg/dL (ref 6–20)
CHLORIDE: 103 mmol/L (ref 101–111)
Calcium, Ion: 1.1 mmol/L — ABNORMAL LOW (ref 1.15–1.40)
Creatinine, Ser: 1 mg/dL (ref 0.44–1.00)
GLUCOSE: 109 mg/dL — AB (ref 65–99)
HCT: 38 % (ref 36.0–46.0)
Hemoglobin: 12.9 g/dL (ref 12.0–15.0)
Potassium: 3.1 mmol/L — ABNORMAL LOW (ref 3.5–5.1)
Sodium: 140 mmol/L (ref 135–145)
TCO2: 23 mmol/L (ref 22–32)

## 2017-10-04 LAB — I-STAT BETA HCG BLOOD, ED (MC, WL, AP ONLY)

## 2017-10-04 MED ORDER — KETOROLAC TROMETHAMINE 30 MG/ML IJ SOLN
30.0000 mg | Freq: Once | INTRAMUSCULAR | Status: AC
  Administered 2017-10-04: 30 mg via INTRAVENOUS
  Filled 2017-10-04: qty 1

## 2017-10-04 MED ORDER — ONDANSETRON HCL 4 MG/2ML IJ SOLN
4.0000 mg | Freq: Once | INTRAMUSCULAR | Status: AC
  Administered 2017-10-04: 4 mg via INTRAVENOUS
  Filled 2017-10-04: qty 2

## 2017-10-04 MED ORDER — HYDROMORPHONE HCL 1 MG/ML IJ SOLN
1.0000 mg | Freq: Once | INTRAMUSCULAR | Status: AC
  Administered 2017-10-04: 1 mg via INTRAVENOUS
  Filled 2017-10-04: qty 1

## 2017-10-04 MED ORDER — SODIUM CHLORIDE 0.9 % IV BOLUS
1000.0000 mL | Freq: Once | INTRAVENOUS | Status: AC
  Administered 2017-10-04: 1000 mL via INTRAVENOUS

## 2017-10-04 NOTE — ED Provider Notes (Signed)
[facility] DEPT Provider Note   CSN: [id number] Arrival date & time: 10/04/17  2110    History   Chief Complaint Chief Complaint  Patient presents with  . Flank Pain    HPI Brenda Dyer is a 62 y.o. female.  62 year old female with no significant past medical history presents to the emergency department for evaluation of flank pain.  Symptoms began at [place] and originate in her right flank and radiate towards her right lower abdomen.  She has had vomiting and dry heaves associated with worsening pain which continues to wax and wane.  She denies any specific nausea.  No associated dysuria, hematuria, fevers.  Pain was sudden in onset and has been constant.  She denies a history of kidney stones.  Abdominal surgical history significant for cesarean section.  She was unable to take any medications prior to arrival secondary to vomiting.  The history is provided by the patient. No language interpreter was used.  Flank Pain     Past Medical History:  Diagnosis Date  . Abnormal Pap smear of cervix 1997, 2006-2009   --1997 pt. had colpo/cryotherapy to cervix in [place], [place], 2006-2009 abn.paps(dysplasia) and colposcopies but no treatment to cervix--paps returned to normal  . Genital warts 1984  . STD (sexually transmitted disease)    Hx condyloma    Patient Active Problem List   Diagnosis Date Noted  . METATARSALGIA 07/12/2010  . ABNORMALITY OF GAIT 07/12/2010  . 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 UNEQUAL LEG LENGTH 07/21/2008  . SHIN SPLINTS 07/21/2008    Past Surgical History:  Procedure Laterality Date  . CESAREAN SECTION  1991  . [facility]   done in [place], [place]   d/t back pain--nl--in [place], [place]     OB History    Gravida  1   Para  1   Term  1   Preterm      AB      Living  1     SAB      TAB      Ectopic      Multiple      Live Births               Home Medications    Prior to Admission medications   Medication Sig Start Date End Date Taking? Authorizing Provider  diclofenac sodium (VOLTAREN) 1 % GEL Apply 2 g topically 4 (four) times daily. Patient not taking: Reported on 10/05/2017 08/25/17   Leandrew Koyanagi, MD  ibuprofen (ADVIL,MOTRIN) 600 MG tablet Take 1 tablet (600 mg total) by mouth every 6 (six) hours as needed. 10/05/17   Antonietta Breach, PA-C  ondansetron (ZOFRAN ODT) 4 MG disintegrating tablet Take 1 tablet (4 mg total) by mouth every 6 (six) hours as needed for nausea or vomiting. 10/05/17   Antonietta Breach, PA-C  oxyCODONE-acetaminophen (PERCOCET/ROXICET) 5-325 MG tablet Take 1-2 tablets by mouth every 6 (six) hours as needed for severe pain. 10/05/17   Antonietta Breach, PA-C  tamsulosin (FLOMAX) 0.4 MG CAPS capsule Take 1 capsule (0.4 mg total) by mouth daily. 10/05/17   Antonietta Breach, PA-C    Family History Family History  Problem Relation Age of Onset  . Heart attack Father   . Diabetes Father   . Other Father        vascular/cluster H/As  . Stroke Father   . Hyperlipidemia Brother   . Breast cancer Neg  Hx     Social History Social History   Tobacco Use  . Smoking status: Never Smoker  . Smokeless tobacco: Never Used  Substance Use Topics  . Alcohol use: Yes    Alcohol/week: 1.2 oz    Types: 2 Standard drinks or equivalent per week  . Drug use: No     Allergies   Codeine   Review of Systems Review of Systems  Genitourinary: Positive for flank pain.  Ten systems reviewed and are negative for acute change, except as noted in the HPI.    Physical Exam Updated Vital Signs BP 116/63   Pulse 65   Temp 98 F (36.7 C) (Oral)   Resp 15   Ht 5\' 6"  (1.676 m)   Wt 64.9 kg (143 lb)   LMP 07/02/2007 (Approximate)   SpO2 98%   BMI 23.08 kg/m   Physical Exam  Constitutional: She is oriented to person, place, and time. She appears well-developed and well-nourished. No distress.  Nontoxic appearing. Dry heaving.  Uncomfortable.  HENT:  Head: Normocephalic and atraumatic.  Eyes: Conjunctivae and EOM are normal. No scleral icterus.  Neck: Normal range of motion.  Cardiovascular: Normal rate, regular rhythm and intact distal pulses.  Pulmonary/Chest: Effort normal. No stridor. No respiratory distress. She has no wheezes.  Respirations even and unlabored. Lungs CTAB.  Musculoskeletal: Normal range of motion.  Neurological: She is alert and oriented to person, place, and time. She exhibits normal muscle tone. Coordination normal.  Skin: Skin is warm and dry. No rash noted. She is not diaphoretic. No erythema. No pallor.  Psychiatric: She has a normal mood and affect. Her behavior is normal.  Nursing note and vitals reviewed.    ED Treatments / Results  Labs (all labs ordered are listed, but only abnormal results are displayed) Labs Reviewed  URINALYSIS, ROUTINE W REFLEX MICROSCOPIC - Abnormal; Notable for the following components:      Result Value   APPearance HAZY (*)    Hgb urine dipstick LARGE (*)    Ketones, ur 20 (*)    Protein, ur 100 (*)    Squamous Epithelial / LPF 0-5 (*)    All other components within normal limits  I-STAT CHEM 8, ED - Abnormal; Notable for the following components:   Potassium 3.1 (*)    Glucose, Bld 109 (*)    Calcium, Ion 1.10 (*)    All other components within normal limits  I-STAT BETA HCG BLOOD, ED ([facility], [facility], [facility] ONLY)    EKG None  Radiology Ct Renal Stone Study  Result Date: 10/05/2017 CLINICAL DATA:  Right flank pain, nausea, and vomiting. EXAM: CT ABDOMEN AND PELVIS WITHOUT CONTRAST TECHNIQUE: Multidetector CT imaging of the abdomen and pelvis was performed following the standard protocol without IV contrast. COMPARISON:  MRI pelvis 09/26/2011 FINDINGS: Lower chest: Atelectasis in the lung bases. Small esophageal hiatal hernia. Hepatobiliary: No focal liver abnormality is seen. No gallstones, gallbladder wall thickening, or biliary dilatation. Pancreas:  Unremarkable. No pancreatic ductal dilatation or surrounding inflammatory changes. Spleen: Normal in size without focal abnormality. Adrenals/Urinary Tract: No adrenal gland nodules. 3 mm stone in the right ureterovesical junction. Right hydronephrosis and hydroureter with stranding around the right kidney and ureter. Left kidney and ureter are unremarkable. Bladder wall is not thickened and no bladder filling defects are demonstrated. Stomach/Bowel: Stomach, small bowel, and colon are mostly decompressed, limiting evaluation. No wall thickening or inflammatory changes are appreciated. Diverticulosis of the sigmoid colon without evidence of diverticulitis. The  appendix is normal. Vascular/Lymphatic: No significant vascular findings are present. No enlarged abdominal or pelvic lymph nodes. Reproductive: Uterus and bilateral adnexa are unremarkable. Other: No abdominal wall hernia or abnormality. No abdominopelvic ascites. Musculoskeletal: No acute or significant osseous findings. IMPRESSION: 1. 3 mm stone in the distal right ureter with moderate proximal obstruction. 2. Small esophageal hiatal hernia. 3. Diverticulosis of the sigmoid colon without evidence of diverticulitis. Electronically Signed   By: Lucienne Capers M.D.   On: 10/05/2017 00:10    Procedures Procedures (including critical care time)  Medications Ordered in ED Medications  ketorolac (TORADOL) 30 MG/ML injection 30 mg (30 mg Intravenous Given 10/04/17 2217)  ondansetron (ZOFRAN) injection 4 mg (4 mg Intravenous Given 10/04/17 2217)  HYDROmorphone (DILAUDID) injection 1 mg (1 mg Intravenous Given 10/04/17 2217)  sodium chloride 0.9 % bolus 1,000 mL (0 mLs Intravenous Stopped 10/04/17 2355)  oxyCODONE-acetaminophen (PERCOCET/ROXICET) 5-325 MG per tablet 2 tablet (2 tablets Oral Given 10/05/17 0053)     Initial Impression / Assessment and Plan / ED Course  I have reviewed the triage vital signs and the nursing notes.  Pertinent labs & imaging  results that were available during my care of the patient were reviewed by me and considered in my medical decision making (see chart for details).     Pt has been diagnosed with a kidney stone via CT. There is no evidence of significant hydronephrosis, serum creatine WNL, vitals sign stable and the pt does not have irratractable vomiting. Patient will be discharged home with pain medications and has been advised to follow up with Urology as needed. Return precautions discussed and provided. Patient discharged in stable condition with no unaddressed concerns.   Final Clinical Impressions(s) / ED Diagnoses   Final diagnoses:  Right flank pain    ED Discharge Orders        Ordered    oxyCODONE-acetaminophen (PERCOCET/ROXICET) 5-325 MG tablet  Every 6 hours PRN     10/05/17 0148    tamsulosin (FLOMAX) 0.4 MG CAPS capsule  Daily     10/05/17 0148    ibuprofen (ADVIL,MOTRIN) 600 MG tablet  Every 6 hours PRN     10/05/17 0148    ondansetron (ZOFRAN ODT) 4 MG disintegrating tablet  Every 6 hours PRN     10/05/17 0148       Antonietta Breach, PA-C 10/05/17 0938    Lajean Saver, MD 10/05/17 1643

## 2017-10-04 NOTE — ED Triage Notes (Signed)
States about 1900 pm right flank pain voiced with nausea and vomiting voiced no hematuria voiced.

## 2017-10-05 LAB — URINALYSIS, ROUTINE W REFLEX MICROSCOPIC
BILIRUBIN URINE: NEGATIVE
Bacteria, UA: NONE SEEN
Glucose, UA: NEGATIVE mg/dL
Ketones, ur: 20 mg/dL — AB
LEUKOCYTES UA: NEGATIVE
Nitrite: NEGATIVE
PH: 7 (ref 5.0–8.0)
Protein, ur: 100 mg/dL — AB
Specific Gravity, Urine: 1.023 (ref 1.005–1.030)

## 2017-10-05 MED ORDER — TAMSULOSIN HCL 0.4 MG PO CAPS: 0.4000 mg | ORAL_CAPSULE | Freq: Every day | ORAL | 0 refills | Status: DC

## 2017-10-05 MED ORDER — ONDANSETRON 4 MG PO TBDP: 4.0000 mg | ORAL_TABLET | Freq: Four times a day (QID) | ORAL | 0 refills | Status: DC | PRN

## 2017-10-05 MED ORDER — IBUPROFEN 600 MG PO TABS: 600.0000 mg | ORAL_TABLET | Freq: Four times a day (QID) | ORAL | 0 refills | Status: DC | PRN

## 2017-10-05 MED ORDER — OXYCODONE-ACETAMINOPHEN 5-325 MG PO TABS
2.0000 | ORAL_TABLET | Freq: Once | ORAL | Status: AC
  Administered 2017-10-05: 2 via ORAL
  Filled 2017-10-05: qty 2

## 2017-10-05 MED ORDER — OXYCODONE-ACETAMINOPHEN 5-325 MG PO TABS: 1.0000 | ORAL_TABLET | Freq: Four times a day (QID) | ORAL | 0 refills | Status: DC | PRN

## 2017-10-05 NOTE — Discharge Instructions (Signed)
Your CT showed a 3 mm kidney stone on the right side which should pass into your bladder in the next few days.  We recommend the use of Percocet as prescribed for pain control, supplemented with ibuprofen.  You may take Flomax to promote stone movement and Zofran as needed for nausea and vomiting.  Be sure to drink plenty of fluids as well.  You may follow-up with a urologist if desired.  Return to the emergency department for new or concerning symptoms.

## 2017-12-04 ENCOUNTER — Other Ambulatory Visit: Payer: Self-pay | Admitting: Obstetrics and Gynecology

## 2017-12-04 DIAGNOSIS — Z1231 Encounter for screening mammogram for malignant neoplasm of breast: Secondary | ICD-10-CM

## 2017-12-22 ENCOUNTER — Telehealth: Payer: Self-pay | Admitting: Obstetrics and Gynecology

## 2017-12-22 DIAGNOSIS — K5900 Constipation, unspecified: Secondary | ICD-10-CM

## 2017-12-22 DIAGNOSIS — Z1211 Encounter for screening for malignant neoplasm of colon: Secondary | ICD-10-CM

## 2017-12-22 NOTE — Telephone Encounter (Signed)
Patient left voicemail requesting a suggestion for a gastroenterologist. She would like to switch to another female, if possible.

## 2017-12-22 NOTE — Telephone Encounter (Signed)
Spoke with patient. Patient would like a referral to a new GI. Unhappy with current MD. Reports she is due for a colonoscopy and is having constipation issues. Referral placed to Dr.Nandigam. Patient verbalizes understanding and is agreeable. Aware she will be contacted to schedule an appointment.  Routing to provider for final review. Patient agreeable to disposition. Will close encounter.

## 2017-12-26 ENCOUNTER — Telehealth: Payer: Self-pay

## 2017-12-26 NOTE — Telephone Encounter (Signed)
Faxed request for records to [facility] 12/26/17 LM

## 2017-12-31 ENCOUNTER — Telehealth: Payer: Self-pay | Admitting: Gastroenterology

## 2017-12-31 NOTE — Telephone Encounter (Signed)
Received GI records from Dr. Lorie Apley office. Patient is requesting to see Dr. Silverio Decamp. She says that the reason why she is transferring is because she felt "rushed" at every office visit.

## 2018-01-07 NOTE — Telephone Encounter (Signed)
Left message for patient to return my call to schedule a new patient appointment.

## 2018-01-07 NOTE — Telephone Encounter (Signed)
Ok to schedule. Thanks

## 2018-01-08 ENCOUNTER — Encounter: Payer: Self-pay | Admitting: Gastroenterology

## 2018-01-19 ENCOUNTER — Other Ambulatory Visit: Payer: Self-pay | Admitting: Family Medicine

## 2018-01-19 DIAGNOSIS — M5432 Sciatica, left side: Secondary | ICD-10-CM

## 2018-01-25 ENCOUNTER — Other Ambulatory Visit: Payer: Federal, State, Local not specified - PPO

## 2018-01-26 NOTE — Telephone Encounter (Signed)
Verified with patient procedure with Dr.Mann was cancelled.

## 2018-02-15 ENCOUNTER — Ambulatory Visit
Admission: RE | Admit: 2018-02-15 | Discharge: 2018-02-15 | Disposition: A | Discharge status: Home / Self Care | Payer: Federal, State, Local not specified - PPO | Source: Ambulatory Visit | Attending: Family Medicine | Admitting: Family Medicine

## 2018-02-15 DIAGNOSIS — M5432 Sciatica, left side: Secondary | ICD-10-CM

## 2018-02-16 ENCOUNTER — Ambulatory Visit
Admission: RE | Admit: 2018-02-16 | Discharge: 2018-02-16 | Disposition: A | Discharge status: Home / Self Care | Payer: Federal, State, Local not specified - PPO | Source: Ambulatory Visit | Attending: Obstetrics and Gynecology | Admitting: Obstetrics and Gynecology

## 2018-02-16 DIAGNOSIS — Z1231 Encounter for screening mammogram for malignant neoplasm of breast: Secondary | ICD-10-CM

## 2018-03-10 ENCOUNTER — Ambulatory Visit: Payer: Federal, State, Local not specified - PPO | Admitting: Gastroenterology

## 2018-03-10 ENCOUNTER — Encounter: Payer: Self-pay | Admitting: Gastroenterology

## 2018-03-10 VITALS — BP 108/68 | HR 74 | Ht 66.0 in | Wt 144.1 lb

## 2018-03-10 DIAGNOSIS — K7689 Other specified diseases of liver: Secondary | ICD-10-CM

## 2018-03-10 DIAGNOSIS — K5791 Diverticulosis of intestine, part unspecified, without perforation or abscess with bleeding: Secondary | ICD-10-CM | POA: Diagnosis not present

## 2018-03-10 DIAGNOSIS — K5909 Other constipation: Secondary | ICD-10-CM | POA: Diagnosis not present

## 2018-03-10 DIAGNOSIS — Z1211 Encounter for screening for malignant neoplasm of colon: Secondary | ICD-10-CM

## 2018-03-10 NOTE — Progress Notes (Signed)
Brenda Dyer    [id number]    1955-08-31  Primary Care Physician:Webb, Arbie Cookey, MD  Referring Physician: Darcus Austin, MD [place], [place] [address]  Chief complaint: Constipation HPI: 62 year old female here for new patient visit to establish care.  She was previously followed by Dr. Mar Daring man. Patient has history of chronic constipation, has tried MiraLAX and lenses with no significant improvement in the past.  She would sometimes go 5 to 7 days with no bowel movement, her normal was once every 3 to 4 days and would pass small pellets or pencil thin stool at times.  But in the past month her bowel habits have improved since she started eating more fiber in her diet and is having regular bowel movement once daily or once every other day and is taking stool softener occasionally as needed. She has occasional symptoms from hemorrhoids and intermittent lower abdominal cramping but denies any significant abdominal pain, nausea, vomiting, blood per rectum, loss of appetite or weight loss. Last Colonoscopy April 1st 2009 by Dr. Collene Mares showed sigmoid diverticulosis and internal hemorrhoids otherwise normal exam No family history of IBD or colon cancer  Outpatient Encounter Medications as of 03/10/2018  Medication Sig  . diclofenac sodium (VOLTAREN) 1 % GEL Apply 2 g topically 4 (four) times daily. (Patient not taking: Reported on 10/05/2017)  . ibuprofen (ADVIL,MOTRIN) 600 MG tablet Take 1 tablet (600 mg total) by mouth every 6 (six) hours as needed.  . ondansetron (ZOFRAN ODT) 4 MG disintegrating tablet Take 1 tablet (4 mg total) by mouth every 6 (six) hours as needed for nausea or vomiting.  Marland Kitchen oxyCODONE-acetaminophen (PERCOCET/ROXICET) 5-325 MG tablet Take 1-2 tablets by mouth every 6 (six) hours as needed for severe pain.  . tamsulosin (FLOMAX) 0.4 MG CAPS capsule Take 1 capsule (0.4 mg total) by mouth daily.   No facility-administered encounter  medications on file as of 03/10/2018.     Allergies as of 03/10/2018 - Review Complete 10/05/2017  Allergen Reaction Noted  . Codeine Nausea Only 02/06/2015    Past Medical History:  Diagnosis Date  . Abnormal Pap smear of cervix 1997, 2006-2009   --1997 pt. had colpo/cryotherapy to cervix in [place], [place], 2006-2009 abn.paps(dysplasia) and colposcopies but no treatment to cervix--paps returned to normal  . Genital warts 1984  . STD (sexually transmitted disease)    Hx condyloma    Past Surgical History:  Procedure Laterality Date  . CESAREAN SECTION  1991  . GYNECOLOGIC CRYOSURGERY  1997   done in [place], [place]   d/t back pain--nl--in [place], [place]    Family History  Problem Relation Age of Onset  . Heart attack Father   . Diabetes Father   . Other Father        vascular/cluster H/As  . Stroke Father   . Hyperlipidemia Brother   . Breast cancer Neg Hx     Social History   Socioeconomic History  . Marital status: Married    Spouse name: Not on file  . Number of children: Not on file  . Years of education: Not on file  . Highest education level: Not on file  Occupational History  . Not on file  Social Needs  . Financial resource strain: Not on file  . Food insecurity:    Worry: Not on file    Inability: Not on file  . Transportation needs:  Medical: Not on file    Non-medical: Not on file  Tobacco Use  . Smoking status: Never Smoker  . Smokeless tobacco: Never Used  Substance and Sexual Activity  . Alcohol use: Yes    Alcohol/week: 2.0 standard drinks    Types: 2 Standard drinks or equivalent per week  . Drug use: No  . Sexual activity: Not Currently    Partners: Male    Birth control/protection: Post-menopausal  Lifestyle  . Physical activity:    Days per week: Not on file    Minutes per session: Not on file  . Stress: Not on file  Relationships  . Social connections:    Talks on phone: Not on file    Gets together: Not on  file    Attends religious service: Not on file    Active member of club or organization: Not on file    Attends meetings of clubs or organizations: Not on file    Relationship status: Not on file  . Intimate partner violence:    Fear of current or ex partner: Not on file    Emotionally abused: Not on file    Physically abused: Not on file    Forced sexual activity: Not on file  Other Topics Concern  . Not on file  Social History Narrative  . Not on file      Review of systems: Review of Systems  Constitutional: Negative for fever and chills.  HENT: Negative.   Eyes: Negative for blurred vision.  Respiratory: Negative for cough, shortness of breath and wheezing.   Cardiovascular: Negative for chest pain and palpitations.  Gastrointestinal: as per HPI Genitourinary: Negative for dysuria, urgency, frequency and hematuria.  Musculoskeletal: Positive for myalgias, back pain and joint pain.  Skin: Negative for itching and rash.  Neurological: Negative for dizziness, tremors, focal weakness, seizures and loss of consciousness.  Endo/Heme/Allergies: Negative for seasonal allergies.  Psychiatric/Behavioral: Negative for depression, suicidal ideas and hallucinations.  All other systems reviewed and are negative.   Physical Exam: Vitals:   03/10/18 0911  BP: 108/68  Pulse: 74   Body mass index is 23.26 kg/m. Gen:      No acute distress HEENT:  EOMI, sclera anicteric Neck:     No masses; no thyromegaly Lungs:    Clear to auscultation bilaterally; normal respiratory effort CV:         Regular rate and rhythm; no murmurs Abd:      + bowel sounds; soft, non-tender; no palpable masses, no distension Ext:    No edema; adequate peripheral perfusion Skin:      Warm and dry; no rash Neuro: alert and oriented x 3 Psych: normal mood and affect  Data Reviewed:  Reviewed labs, radiology imaging, old records and pertinent past GI work up   Assessment and  Plan/Recommendations:  62 year old female with history of chronic constipation, sigmoid diverticulosis and internal hemorrhoids here to establish care Due for screening colonoscopy, will schedule it The risks and benefits as well as alternatives of endoscopic procedure(s) have been discussed and reviewed. All questions answered. The patient agrees to proceed. Benefiber 1 teaspoon 3 times daily with meals  MiraLAX half capful daily at bedtime as needed, titrate to have 1-2 soft bowel movements daily Incidental finding of 11 mm simple cyst in posterior right hepatic lobe on MRI lumbar spine, appears benign.  Reassured patient.  Will schedule for limited abdominal ultrasound to follow-up, if no change in size no further follow-up is recommended  Damaris Hippo , MD [phone]    CC: Darcus Austin, MD

## 2018-03-10 NOTE — Patient Instructions (Signed)
You have been scheduled for a colonoscopy. Please follow written instructions given to you at your visit today.  Please pick up your prep supplies at the pharmacy within the next 1-3 days. If you use inhalers (even only as needed), please bring them with you on the day of your procedure.  We have given you a sample Suprep kit today   Take Benefiber 1 teaspoon three times a day with meals  Use Fiberchoice capsules 2-3 times daily with meals  Take IBGard 1 capsule three times a day as needed  Use Miralax 1-2 teaspoons daily at bedtime as needed   You will need a follow up ultrasound limited in 6 months, we will give you a reminder call   Constipation, Adult Constipation is when a person:  Poops (has a bowel movement) fewer times in a week than normal.  Has a hard time pooping.  Has poop that is dry, hard, or bigger than normal.  Follow these instructions at home: Eating and drinking   Eat foods that have a lot of fiber, such as: ? Fresh fruits and vegetables. ? Whole grains. ? Beans.  Eat less of foods that are high in fat, low in fiber, or overly processed, such as: ? [place] fries. ? Hamburgers. ? Cookies. ? Candy. ? Soda.  Drink enough fluid to keep your pee (urine) clear or pale yellow. General instructions  Exercise regularly or as told by your doctor.  Go to the restroom when you feel like you need to poop. Do not hold it in.  Take over-the-counter and prescription medicines only as told by your doctor. These include any fiber supplements.  Do pelvic floor retraining exercises, such as: ? Doing deep breathing while relaxing your lower belly (abdomen). ? Relaxing your pelvic floor while pooping.  Watch your condition for any changes.  Keep all follow-up visits as told by your doctor. This is important. Contact a doctor if:  You have pain that gets worse.  You have a fever.  You have not pooped for 4 days.  You throw up (vomit).  You are not  hungry.  You lose weight.  You are bleeding from the anus.  You have thin, pencil-like poop (stool). Get help right away if:  You have a fever, and your symptoms suddenly get worse.  You leak poop or have blood in your poop.  Your belly feels hard or bigger than normal (is bloated).  You have very bad belly pain.  You feel dizzy or you faint. This information is not intended to replace advice given to you by your health care provider. Make sure you discuss any questions you have with your health care provider. Document Released: 12/04/2007 Document Revised: 01/05/2016 Document Reviewed: 12/06/2015 Elsevier Interactive Patient Education  2018 [employer].

## 2018-03-11 ENCOUNTER — Ambulatory Visit (INDEPENDENT_AMBULATORY_CARE_PROVIDER_SITE_OTHER): Payer: Federal, State, Local not specified - PPO | Admitting: Obstetrics and Gynecology

## 2018-03-11 ENCOUNTER — Other Ambulatory Visit: Payer: Self-pay

## 2018-03-11 ENCOUNTER — Encounter: Payer: Self-pay | Admitting: Obstetrics and Gynecology

## 2018-03-11 VITALS — BP 116/68 | HR 72 | Resp 14 | Ht 65.5 in | Wt 142.0 lb

## 2018-03-11 DIAGNOSIS — Z01419 Encounter for gynecological examination (general) (routine) without abnormal findings: Secondary | ICD-10-CM | POA: Diagnosis not present

## 2018-03-11 NOTE — Progress Notes (Signed)
62 y.o. G36P1001 Married Caucasian female here for annual exam.    Some constipation.  Saw Dr. Burna Mortimer yesterday.  Will have colonoscopy this week.   No vaginal bleeding.   Kidney stone in the last year.   Went to [place].   PCP: Maurice Small, MD   Patient's last menstrual period was 07/02/2007 (approximate).           Sexually active: No.  The current method of family planning is post menopausal status.    Exercising: Yes.    yoga and walking Smoker:  no  Health Maintenance: Pap:  02/28/17 Neg:Neg HR HPV, 02/06/15 - neg, neg HR HPV.  History of abnormal Pap:  Yes, Hx of colposcopy/cryotherapy to cervix 1997 in [place]. Pt. Also states hx of dysplastic paps from 2006-2009 with colposcopies but no treatment to cervix. States history of positive HR HPV. MMG:  02/16/18 BIRADS 1 negative/density c Colonoscopy:  2009 normal with Dr. Collene Mares; patient scheduled for 03/13/18 BMD:   2011  Result  normal TDaP:  Up to date w/ PCP Gardasil:   n/a HIV and Hep C: 02/22/16 Negative Screening Labs:  PCP   reports that she has quit smoking. She has never used smokeless tobacco. She reports that she drinks about 2.0 standard drinks of alcohol per week. She reports that she does not use drugs.  Past Medical History:  Diagnosis Date  . Abnormal Pap smear of cervix 1997, 2006-2009   --1997 pt. had colpo/cryotherapy to cervix in [place], [place], 2006-2009 abn.paps(dysplasia) and colposcopies but no treatment to cervix--paps returned to normal  . Genital warts 1984  . Kidney stones   . Scoliosis   . STD (sexually transmitted disease)    Hx condyloma    Past Surgical History:  Procedure Laterality Date  . CESAREAN SECTION  1991  . GYNECOLOGIC CRYOSURGERY  1997   done in [place], [place]   d/t back pain--nl--in [place], [place]    Current Outpatient Medications  Medication Sig Dispense Refill  . diclofenac sodium (VOLTAREN) 1 % GEL Apply 2 g topically 4 (four) times daily. 1 Tube 5   . ibuprofen (ADVIL,MOTRIN) 600 MG tablet Take 1 tablet (600 mg total) by mouth every 6 (six) hours as needed. 30 tablet 0   No current facility-administered medications for this visit.     Family History  Problem Relation Age of Onset  . Heart attack Father   . Diabetes Father   . Other Father        vascular/cluster H/As  . Stroke Father   . Hyperlipidemia Brother   . Breast cancer Neg Hx     Review of Systems  All other systems reviewed and are negative.   Exam:   BP 116/68 (BP Location: Right Arm, Patient Position: Sitting, Cuff Size: Normal)   Pulse 72   Resp 14   Ht 5' 5.5" (1.664 m)   Wt 142 lb (64.4 kg)   LMP 07/02/2007 (Approximate)   BMI 23.27 kg/m     General appearance: alert, cooperative and appears stated age Head: Normocephalic, without obvious abnormality, atraumatic Neck: no adenopathy, supple, symmetrical, trachea midline and thyroid normal to inspection and palpation Lungs: clear to auscultation bilaterally Breasts: normal appearance, no masses or tenderness, No nipple retraction or dimpling, No nipple discharge or bleeding, No axillary or supraclavicular adenopathy Heart: regular rate and rhythm Abdomen: soft, non-tender; no masses, no organomegaly Extremities: extremities normal, atraumatic, no cyanosis or edema Skin: Skin color, texture, turgor  normal. No rashes or lesions Lymph nodes: Cervical, supraclavicular, and axillary nodes normal. No abnormal inguinal nodes palpated Neurologic: Grossly normal  Pelvic: External genitalia:  no lesions              Urethra:  normal appearing urethra with no masses, tenderness or lesions              Bartholins and Skenes: normal                 Vagina: normal appearing vagina with normal color and discharge, no lesions              Cervix: no lesions              Pap taken: No. Bimanual Exam:  Uterus:  normal size, contour, position, consistency, mobility, non-tender              Adnexa: no mass, fullness,  tenderness              Rectal exam: Yes.  .  Confirms.              Anus:  normal sphincter tone, no lesions  Chaperone was present for exam.  Assessment:   Well woman visit with normal exam. Possible nabothian cyst of the cervix.  Remote hx of abnormal pap.  Constipation.  Plan: Mammogram screening. Recommended self breast awareness. Pap and HR HPV as above. Guidelines for Calcium, Vitamin D, regular exercise program including cardiovascular and weight bearing exercise. Colonoscopy in 2 days.  Follow up annually and prn.   After visit summary provided.

## 2018-03-11 NOTE — Patient Instructions (Signed)

## 2018-03-13 ENCOUNTER — Encounter: Payer: Self-pay | Admitting: Gastroenterology

## 2018-03-13 ENCOUNTER — Ambulatory Visit (AMBULATORY_SURGERY_CENTER): Payer: Federal, State, Local not specified - PPO | Admitting: Gastroenterology

## 2018-03-13 VITALS — BP 110/68 | HR 68 | Temp 98.7°F | Resp 13 | Ht 66.0 in | Wt 144.0 lb

## 2018-03-13 DIAGNOSIS — D123 Benign neoplasm of transverse colon: Secondary | ICD-10-CM

## 2018-03-13 DIAGNOSIS — K5909 Other constipation: Secondary | ICD-10-CM

## 2018-03-13 DIAGNOSIS — Z1211 Encounter for screening for malignant neoplasm of colon: Secondary | ICD-10-CM | POA: Diagnosis not present

## 2018-03-13 MED ORDER — SODIUM CHLORIDE 0.9 % IV SOLN: 500.0000 mL | Freq: Once | INTRAVENOUS | Status: DC

## 2018-03-13 NOTE — Op Note (Signed)
[facility] Patient Name: Brenda Dyer Procedure Date: 03/13/2018 11:20 AM MRN: [id number] Endoscopist: Mauri Pole , MD Age: 62 Referring MD:  Date of Birth: [birth year] Gender: Female Account #: [id number] Procedure:                Colonoscopy Indications:              Screening for colorectal malignant neoplasm Medicines:                Monitored Anesthesia Care Procedure:                Pre-Anesthesia Assessment:                           - Prior to the procedure, a History and Physical                            was performed, and patient medications and                            allergies were reviewed. The patient's tolerance of                            previous anesthesia was also reviewed. The risks                            and benefits of the procedure and the sedation                            options and risks were discussed with the patient.                            All questions were answered, and informed consent                            was obtained. Prior Anticoagulants: The patient has                            taken no previous anticoagulant or antiplatelet                            agents. ASA Grade Assessment: II - A patient with                            mild systemic disease. After reviewing the risks                            and benefits, the patient was deemed in                            satisfactory condition to undergo the procedure.                           After obtaining informed consent, the colonoscope  was passed under direct vision. Throughout the                            procedure, the patient's blood pressure, pulse, and                            oxygen saturations were monitored continuously. The                            Model PCF-H190DL [id number]) scope was introduced                            through the anus and advanced to the the terminal   ileum, with identification of the appendiceal                            orifice and IC valve. The colonoscopy was                            technically difficult and complex due to multiple                            diverticula in the colon, restricted mobility of                            the colon and a tortuous colon. Successful                            completion of the procedure was aided by                            withdrawing the scope and replacing with the adult                            endoscope, applying abdominal pressure and lavage.                            The patient tolerated the procedure well. The                            quality of the bowel preparation was adequate. Scope In: 11:30:02 AM Scope Out: 12:05:12 PM Scope Withdrawal Time: 0 hours 12 minutes 28 seconds  Total Procedure Duration: 0 hours 35 minutes 10 seconds  Findings:                 The perianal and digital rectal examinations were                            normal.                           A 5 mm polyp was found in the transverse colon. The  polyp was sessile. The polyp was removed with a                            cold snare. Resection and retrieval were complete.                           A 2 mm polyp was found in the transverse colon. The                            polyp was sessile. The polyp was removed with a                            cold biopsy forceps. Resection and retrieval were                            complete.                           Multiple small and large-mouthed diverticula were                            found in the sigmoid colon and descending colon.                            There was narrowing of the colon in association                            with the diverticular opening. Peri-diverticular                            erythema was seen. There was evidence of an                            impacted diverticulum.                            Non-bleeding internal hemorrhoids were found during                            retroflexion. The hemorrhoids were large. Complications:            No immediate complications. Estimated Blood Loss:     Estimated blood loss was minimal. Impression:               - One 5 mm polyp in the transverse colon, removed                            with a cold snare. Resected and retrieved.                           - One 2 mm polyp in the transverse colon, removed                            with a cold biopsy forceps. Resected and retrieved.                           -  Severe diverticulosis in the sigmoid colon and in                            the descending colon. There was narrowing of the                            colon in association with the diverticular opening.                            Peri-diverticular erythema was seen. There was                            evidence of an impacted diverticulum.                           - Non-bleeding internal hemorrhoids. Recommendation:           - Patient has a contact number available for                            emergencies. The signs and symptoms of potential                            delayed complications were discussed with the                            patient. Return to normal activities tomorrow.                            Written discharge instructions were provided to the                            patient.                           - Resume previous diet.                           - Continue present medications.                           - Await pathology results.                           - Repeat colonoscopy in 5-10 years for surveillance                            based on pathology results. Mauri Pole, MD 03/13/2018 12:12:04 PM This report has been signed electronically.

## 2018-03-13 NOTE — Progress Notes (Signed)
A/ox3 pleased with MAC, report to RN 

## 2018-03-13 NOTE — Patient Instructions (Signed)
YOU HAD AN ENDOSCOPIC PROCEDURE TODAY AT THE [facility]:   Refer to the procedure report that was given to you for any specific questions about what was found during the examination.  If the procedure report does not answer your questions, please call your gastroenterologist to clarify.  If you requested that your care partner not be given the details of your procedure findings, then the procedure report has been included in a sealed envelope for you to review at your convenience later.  YOU SHOULD EXPECT: Some feelings of bloating in the abdomen. Passage of more gas than usual.  Walking can help get rid of the air that was put into your GI tract during the procedure and reduce the bloating. If you had a lower endoscopy (such as a colonoscopy or flexible sigmoidoscopy) you may notice spotting of blood in your stool or on the toilet paper. If you underwent a bowel prep for your procedure, you may not have a normal bowel movement for a few days.  Please Note:  You might notice some irritation and congestion in your nose or some drainage.  This is from the oxygen used during your procedure.  There is no need for concern and it should clear up in a day or so.  SYMPTOMS TO REPORT IMMEDIATELY:   Following lower endoscopy (colonoscopy or flexible sigmoidoscopy):  Excessive amounts of blood in the stool  Significant tenderness or worsening of abdominal pains  Swelling of the abdomen that is new, acute  Fever of 100F or higher   For urgent or emergent issues, a gastroenterologist can be reached at any hour by calling [phone].   DIET:  We do recommend a small meal at first, but then you may proceed to your regular diet.  Drink plenty of fluids but you should avoid alcoholic beverages for 24 hours. Try to increase the fiber in your diet, and drink plenty of water.  ACTIVITY:  You should plan to take it easy for the rest of today and you should NOT DRIVE or use heavy machinery until  tomorrow (because of the sedation medicines used during the test).    FOLLOW UP: Our staff will call the number listed on your records the next business day following your procedure to check on you and address any questions or concerns that you may have regarding the information given to you following your procedure. If we do not reach you, we will leave a message.  However, if you are feeling well and you are not experiencing any problems, there is no need to return our call.  We will assume that you have returned to your regular daily activities without incident.  If any biopsies were taken you will be contacted by phone or by letter within the next 1-3 weeks.  Please call us at [phone] if you have not heard about the biopsies in 3 weeks.    SIGNATURES/CONFIDENTIALITY: You and/or your care partner have signed paperwork which will be entered into your electronic medical record.  These signatures attest to the fact that that the information above on your After Visit Summary has been reviewed and is understood.  Full responsibility of the confidentiality of this discharge information lies with you and/or your care-partner.  Read all of the handouts given to you by your recovery room nurse. 

## 2018-03-13 NOTE — Progress Notes (Signed)
Called to room to assist during endoscopic procedure.  Patient ID and intended procedure confirmed with present staff. Received instructions for my participation in the procedure from the performing physician.  

## 2018-03-16 ENCOUNTER — Telehealth: Payer: Self-pay

## 2018-03-16 NOTE — Telephone Encounter (Signed)
  Follow up Call-  Call Namari Breton number 03/13/2018  Post procedure Call Paige Vanderwoude phone  # [phone]  Permission to leave phone message Yes  Some recent data might be hidden     Patient questions:  Do you have a fever, pain , or abdominal swelling? No. Pain Score  0 *  Have you tolerated food without any problems? Yes.    Have you been able to return to your normal activities? Yes.    Do you have any questions about your discharge instructions: Diet   No. Medications  No. Follow up visit  No.  Do you have questions or concerns about your Care? No.  Actions: * If pain score is 4 or above: No action needed, pain <4.

## 2018-03-17 ENCOUNTER — Telehealth: Payer: Self-pay | Admitting: Gastroenterology

## 2018-03-17 NOTE — Telephone Encounter (Signed)
Patient calls because the bowel movements are "like pellets." She is eating high fiber foods like fresh fruits and vegtables. She is taking the Benefiber. Drinking "a lot of water." Reviewed how to titrate the Miralax starting with half a capful. She will call back in a couple of days if she does not have improvement.

## 2018-03-24 ENCOUNTER — Encounter: Payer: Federal, State, Local not specified - PPO | Admitting: Gastroenterology

## 2018-03-27 ENCOUNTER — Telehealth: Payer: Self-pay | Admitting: Gastroenterology

## 2018-03-27 NOTE — Telephone Encounter (Signed)
Patient is adjusting the Miralax and this is working for her. Wanted to ask if it is safe to use on a regular basis. Advised okay to use PRN.

## 2018-04-01 ENCOUNTER — Encounter: Payer: Self-pay | Admitting: Gastroenterology

## 2019-01-20 ENCOUNTER — Other Ambulatory Visit: Payer: Self-pay | Admitting: Obstetrics and Gynecology

## 2019-01-20 DIAGNOSIS — Z1231 Encounter for screening mammogram for malignant neoplasm of breast: Secondary | ICD-10-CM

## 2019-03-05 ENCOUNTER — Ambulatory Visit
Admission: RE | Admit: 2019-03-05 | Discharge: 2019-03-05 | Disposition: A | Discharge status: Home / Self Care | Payer: Federal, State, Local not specified - PPO | Source: Ambulatory Visit | Attending: Obstetrics and Gynecology | Admitting: Obstetrics and Gynecology

## 2019-03-05 ENCOUNTER — Other Ambulatory Visit: Payer: Self-pay

## 2019-03-05 DIAGNOSIS — Z1231 Encounter for screening mammogram for malignant neoplasm of breast: Secondary | ICD-10-CM

## 2019-03-15 ENCOUNTER — Ambulatory Visit: Payer: Federal, State, Local not specified - PPO | Admitting: Obstetrics and Gynecology

## 2019-03-15 ENCOUNTER — Other Ambulatory Visit: Payer: Self-pay

## 2019-03-15 ENCOUNTER — Encounter: Payer: Self-pay | Admitting: Obstetrics and Gynecology

## 2019-03-15 VITALS — BP 122/64 | HR 66 | Temp 97.0°F | Resp 16 | Ht 65.25 in | Wt 142.8 lb

## 2019-03-15 DIAGNOSIS — Z01419 Encounter for gynecological examination (general) (routine) without abnormal findings: Secondary | ICD-10-CM

## 2019-03-15 NOTE — Progress Notes (Signed)
63 y.o. G34P1001 Married Caucasian female here for annual exam.    Has some IBS.  Was supposed to go to [place] during this pandemic, but the travel was cancelled.   PCP: Maurice Small, MD  Patient's last menstrual period was 07/02/2007 (approximate).           Sexually active: No.  The current method of family planning is post menopausal status.    Exercising: Yes.    walking 5 miles a day.  Smoker:  no  Health Maintenance: Pap:  02/28/17 Neg:Neg HR HPV, 02/06/15 - neg, neg HR HPV History of abnormal Pap:  Yes, Hx of colposcopy/cryotherapy to cervix 1997 in [place]. Pt. Also states hx of dysplastic paps from 2006-2009 with colposcopies but no treatment to cervix. States history of positive HR HPV. MMG: 03-05-19 3D Neg/density C/BiRads1 Colonoscopy: 03-13-18 polyps;next 5 years BMD: 10 yrs ago  Result :normal TDaP:  PCP Gardasil:   n/a HIV: 02-22-16 NR Hep C:02-22-16  Screening Labs:  PCP.    reports that she has quit smoking. She has never used smokeless tobacco. She reports current alcohol use of about 2.0 standard drinks of alcohol per week. She reports that she does not use drugs.  Past Medical History:  Diagnosis Date  . Abnormal Pap smear of cervix 1997, 2006-2009   --1997 pt. had colpo/cryotherapy to cervix in [place], [place], 2006-2009 abn.paps(dysplasia) and colposcopies but no treatment to cervix--paps returned to normal  . Arthritis    lumbarspine  . Cataract    bilateral (early)  . Genital warts 1984  . Kidney stones   . Scoliosis   . STD (sexually transmitted disease)    Hx condyloma    Past Surgical History:  Procedure Laterality Date  . CESAREAN SECTION  1991  . GYNECOLOGIC CRYOSURGERY  1997   done in [place], [place]  . MANDIBLE SURGERY Bilateral 2015  . PELVIC LAPAROSCOPY  1995   d/t back pain--nl--in [place], [place]    Current Outpatient Medications  Medication Sig Dispense Refill  . diclofenac sodium (VOLTAREN) 1 % GEL Apply 2 g topically 4 (four) times daily. 1 Tube  5  . ibuprofen (ADVIL,MOTRIN) 600 MG tablet Take 1 tablet (600 mg total) by mouth every 6 (six) hours as needed. 30 tablet 0   No current facility-administered medications for this visit.     Family History  Problem Relation Age of Onset  . Heart attack Father   . Diabetes Father   . Other Father        vascular/cluster H/As  . Stroke Father   . Hyperlipidemia Brother   . Colonic polyp Mother   . Breast cancer Neg Hx     Review of Systems  All other systems reviewed and are negative.   Exam:   BP 122/64   Pulse 66   Temp (!) 97 F (36.1 C) (Temporal)   Resp 16   Ht 5' 5.25" (1.657 m)   Wt 142 lb 12.8 oz (64.8 kg)   LMP 07/02/2007 (Approximate)   BMI 23.58 kg/m     General appearance: alert, cooperative and appears stated age Head: normocephalic, without obvious abnormality, atraumatic Neck: no adenopathy, supple, symmetrical, trachea midline and thyroid normal to inspection and palpation Lungs: clear to auscultation bilaterally Breasts: normal appearance, no masses or tenderness, No nipple retraction or dimpling, No nipple discharge or bleeding, No axillary adenopathy Heart: regular rate and rhythm Abdomen: soft, non-tender; no masses, no organomegaly Extremities: extremities normal, atraumatic, no cyanosis or edema Skin: skin  color, texture, turgor normal. No rashes or lesions Lymph nodes: cervical, supraclavicular, and axillary nodes normal. Neurologic: grossly normal  Pelvic: External genitalia:  no lesions              No abnormal inguinal nodes palpated.              Urethra:  normal appearing urethra with no masses, tenderness or lesions              Bartholins and Skenes: normal                 Vagina: normal appearing vagina with normal color and discharge, no lesions              Cervix: no lesions              Pap taken: No. Bimanual Exam:  Uterus:  normal size, contour, position, consistency, mobility, non-tender              Adnexa: no mass, fullness,  tenderness              Rectal exam: Yes.  .  Confirms.              Anus:  normal sphincter tone, no lesions  Chaperone was present for exam.  Assessment:   Well woman visit with normal exam. Hx abnormal pap.   Plan: Mammogram screening discussed. Self breast awareness reviewed. Pap and HR HPV as above. Guidelines for Calcium, Vitamin D, regular exercise program including cardiovascular and weight bearing exercise. 5 year CIN 3 risk is 0.08%.  BMD around age 74 yo.  Labs with PCP.  Flu vaccine recommended. Follow up annually and prn.     After visit summary provided.

## 2019-03-15 NOTE — Patient Instructions (Signed)

## 2020-01-28 ENCOUNTER — Other Ambulatory Visit: Payer: Self-pay | Admitting: Obstetrics and Gynecology

## 2020-01-28 DIAGNOSIS — Z1231 Encounter for screening mammogram for malignant neoplasm of breast: Secondary | ICD-10-CM

## 2020-02-29 ENCOUNTER — Ambulatory Visit: Payer: Self-pay

## 2020-02-29 ENCOUNTER — Ambulatory Visit: Payer: Federal, State, Local not specified - PPO | Admitting: Orthopaedic Surgery

## 2020-02-29 ENCOUNTER — Encounter: Payer: Self-pay | Admitting: Orthopaedic Surgery

## 2020-02-29 VITALS — Ht 66.0 in | Wt 147.0 lb

## 2020-02-29 DIAGNOSIS — M79672 Pain in left foot: Secondary | ICD-10-CM | POA: Diagnosis not present

## 2020-02-29 DIAGNOSIS — M79671 Pain in right foot: Secondary | ICD-10-CM | POA: Diagnosis not present

## 2020-02-29 MED ORDER — IBUPROFEN 800 MG PO TABS: 800.0000 mg | ORAL_TABLET | Freq: Three times a day (TID) | ORAL | 2 refills | Status: DC | PRN

## 2020-02-29 NOTE — Progress Notes (Signed)
Office Visit Note   Patient: Brenda Dyer           Date of Birth: 01-Jul-1956           MRN: [id number] Visit Date: 02/29/2020              Requested by: Brenda Small, MD [place],  [place] [id number] PCP: Brenda Small, MD   Assessment & Plan: Visit Diagnoses:  1. Bilateral foot pain     Plan: Impression is bilateral second toe MTP pain.  I am thinking that it is either arthritis or synovitis from overuse.  I recommend stiff soled shoe, a trial 2 weeks of NSAIDs.  She will continue to use a metatarsal bar.  Questions encouraged and answered.  Follow-up as needed.  Follow-Up Instructions: Return if symptoms worsen or fail to improve.   Orders:  Orders Placed This Encounter  Procedures  . XR Foot Complete Left  . XR Foot Complete Right   Meds ordered this encounter  Medications  . ibuprofen (ADVIL) 800 MG tablet    Sig: Take 1 tablet (800 mg total) by mouth every 8 (eight) hours as needed.    Dispense:  30 tablet    Refill:  2      Procedures: No procedures performed   Clinical Data: No additional findings.   Subjective: Chief Complaint  Patient presents with  . Right Foot - Pain  . Left Foot - Pain    Brenda Dyer comes in today for bilateral foot pain mainly of the second toes bilaterally.  She states it started swelling and hurting at the end of June when she helped her mother move.  She states that she has noticed some improvement in the pain but she still has some residual swelling in the second toe bilaterally.  Denies any numbness and tingling.  Denies any gout.   Review of Systems  Constitutional: Negative.   HENT: Negative.   Eyes: Negative.   Respiratory: Negative.   Cardiovascular: Negative.   Endocrine: Negative.   Musculoskeletal: Negative.   Neurological: Negative.   Hematological: Negative.   Psychiatric/Behavioral: Negative.   All other systems reviewed and are negative.    Objective: Vital Signs: Ht 5'  6" (1.676 m)   Wt 147 lb (66.7 kg)   LMP 07/02/2007 (Approximate)   BMI 23.73 kg/m   Physical Exam Vitals and nursing note reviewed.  Constitutional:      Appearance: She is well-developed.  Pulmonary:     Effort: Pulmonary effort is normal.  Skin:    General: Skin is warm.     Capillary Refill: Capillary refill takes less than 2 seconds.  Neurological:     Mental Status: She is alert and oriented to person, place, and time.  Psychiatric:        Behavior: Behavior normal.        Thought Content: Thought content normal.        Judgment: Judgment normal.     Ortho Exam Bilateral feet show minimal swelling of the second toe.  No bony tenderness.  Negative Mulder's click.  Mild discomfort with range of motion of the second toe MP joint. Specialty Comments:  No specialty comments available.  Imaging: XR Foot Complete Left  Result Date: 02/29/2020 No acute or structural abnormalities.  XR Foot Complete Right  Result Date: 02/29/2020 No acute or structural abnormalities.    PMFS History: Patient Active Problem List   Diagnosis Date Noted  . METATARSALGIA  07/12/2010  . ABNORMALITY OF GAIT 07/12/2010  . 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 UNEQUAL LEG LENGTH 07/21/2008  . SHIN SPLINTS 07/21/2008   Past Medical History:  Diagnosis Date  . Abnormal Pap smear of cervix 1997, 2006-2009   --1997 pt. had colpo/cryotherapy to cervix in [place], [place], 2006-2009 abn.paps(dysplasia) and colposcopies but no treatment to cervix--paps returned to normal  . Arthritis    lumbarspine  . Cataract    bilateral (early)  . Genital warts 1984  . Kidney stones   . Scoliosis   . STD (sexually transmitted disease)    Hx condyloma    Family History  Problem Relation Age of Onset  . Heart attack Father   . Diabetes Father   . Other Father        vascular/cluster H/As  . Stroke Father   . Hyperlipidemia Brother   . Colonic polyp Mother   . Breast cancer Neg Hx     Past  Surgical History:  Procedure Laterality Date  . CESAREAN SECTION  1991  . GYNECOLOGIC CRYOSURGERY  1997   done in [place], [place]  . MANDIBLE SURGERY Bilateral 2015  . PELVIC LAPAROSCOPY  1995   d/t back pain--nl--in [place], [place]   Social History   Occupational History  . Not on file  Tobacco Use  . Smoking status: Former Research scientist (life sciences)  . Smokeless tobacco: Never Used  Vaping Use  . Vaping Use: Never used  Substance and Sexual Activity  . Alcohol use: Yes    Alcohol/week: 2.0 standard drinks    Types: 2 Standard drinks or equivalent per week  . Drug use: No  . Sexual activity: Not Currently    Partners: Male    Birth control/protection: Post-menopausal

## 2020-03-07 ENCOUNTER — Ambulatory Visit
Admission: RE | Admit: 2020-03-07 | Discharge: 2020-03-07 | Disposition: A | Discharge status: Home / Self Care | Payer: Federal, State, Local not specified - PPO | Source: Ambulatory Visit | Attending: Obstetrics and Gynecology | Admitting: Obstetrics and Gynecology

## 2020-03-07 ENCOUNTER — Other Ambulatory Visit: Payer: Self-pay

## 2020-03-07 DIAGNOSIS — Z1231 Encounter for screening mammogram for malignant neoplasm of breast: Secondary | ICD-10-CM

## 2020-03-08 NOTE — Progress Notes (Signed)
64 y.o. G85P1001 Married Caucasian female here for annual exam.    Patient interested in a pap and HR HPV testing. She has a history of cervical dysplasia and high risk HPV.   No vaginal bleeding.  No bladder control problems.  Has IBS.  Has fecal urgency.  Mostly has constipation.  Seeing Dr. Burna Mortimer.   Brenda Dyer to [place]. Planning on travel to [place].   Received her Covid vaccine.   PCP: Brenda Small, MD    Patient's last menstrual period was 07/02/2007 (approximate).           Sexually active: No.  The current method of family planning is post menopausal status.    Exercising: Yes.    walking Smoker:  no  Health Maintenance: Pap: 02/28/17 Neg:Neg HR HPV, 02/06/15 - neg, neg HR HPV.  Pap 2015 normal per patient. History of abnormal Pap:  Yes,Hx of colposcopy/cryotherapy to cervix 1997 in [place]. Pt. Also states hx of dysplastic paps from 2006-2009 with colposcopies but no treatment to cervix. States history of positive HR HPV.  MMG: 03-07-20 3D/neg/density C/Birads1 Colonoscopy:  03-13-18 polyps;next 5 years BMD:  2010  Result :Normal per patient TDaP:  PCP Gardasil:   no HIV:02-22-16 NR Hep C:02-22-16 Neg Screening Labs:  PCP   reports that she has quit smoking. She has never used smokeless tobacco. She reports current alcohol use of about 2.0 standard drinks of alcohol per week. She reports that she does not use drugs.  Past Medical History:  Diagnosis Date  . Abnormal Pap smear of cervix 1997, 2006-2009   --1997 pt. had colpo/cryotherapy to cervix in [place], [place], 2006-2009 abn.paps(dysplasia) and colposcopies but no treatment to cervix--paps returned to normal  . Arthritis    lumbarspine  . Cataract    bilateral (early)  . Genital warts 1984  . Kidney stones   . Scoliosis   . STD (sexually transmitted disease)    Hx condyloma    Past Surgical History:  Procedure Laterality Date  . CESAREAN SECTION  1991  . GYNECOLOGIC CRYOSURGERY  1997   done in [place], [place]   . MANDIBLE SURGERY Bilateral 2015  . PELVIC LAPAROSCOPY  1995   d/t back pain--nl--in [place], [place]    Current Outpatient Medications  Medication Sig Dispense Refill  . diclofenac sodium (VOLTAREN) 1 % GEL Apply 2 g topically 4 (four) times daily. 1 Tube 5  . ibuprofen (ADVIL) 800 MG tablet Take 1 tablet (800 mg total) by mouth every 8 (eight) hours as needed. 30 tablet 2  . Multiple Vitamins-Minerals (ZINC PO) Take 1 tablet by mouth daily.    Marland Kitchen VITAMIN D PO Take 1 tablet by mouth daily.     No current facility-administered medications for this visit.    Family History  Problem Relation Age of Onset  . Heart attack Father   . Diabetes Father   . Other Father        vascular/cluster H/As  . Stroke Father   . Hyperlipidemia Brother   . Colonic polyp Mother        11/2018  . Stroke Mother   . Breast cancer Neg Hx     Review of Systems  All other systems reviewed and are negative.   Exam:   BP (!) 110/58   Pulse 64   Resp 12   Ht 5\' 5"  (1.651 m)   Wt 145 lb (65.8 kg)   LMP 07/02/2007 (Approximate)   BMI 24.13 kg/m     General appearance: alert,  cooperative and appears stated age Head: normocephalic, without obvious abnormality, atraumatic Neck: no adenopathy, supple, symmetrical, trachea midline and thyroid normal to inspection and palpation Lungs: clear to auscultation bilaterally Breasts: normal appearance, no masses or tenderness, No nipple retraction or dimpling, No nipple discharge or bleeding, No axillary adenopathy Heart: regular rate and rhythm Abdomen: soft, non-tender; no masses, no organomegaly Extremities: extremities normal, atraumatic, no cyanosis or edema Skin: skin color, texture, turgor normal. No rashes or lesions Lymph nodes: cervical, supraclavicular, and axillary nodes normal. Neurologic: grossly normal  Pelvic: External genitalia:  no lesions              No abnormal inguinal nodes palpated.              Urethra:  normal appearing urethra with  no masses, tenderness or lesions              Bartholins and Skenes: normal                 Vagina: normal appearing vagina with normal color and discharge, no lesions              Cervix: no lesions              Pap taken: Yes.   Bimanual Exam:  Uterus:  normal size, contour, position, consistency, mobility, non-tender              Adnexa: no mass, fullness, tenderness              Rectal exam: Yes.  .  Confirms.              Anus:  normal sphincter tone, no lesions  Chaperone was present for exam.  Assessment:   Well woman visit with normal exam. Hx cervical dysplasia and positive HR HPV.  IBS.  Plan: Mammogram screening discussed. Self breast awareness reviewed. Pap and HR HPV collected.  We did review guidelines for cervical cancer screening and that I could not guarantee her insurance would pay for the screening today.  Guidelines for Calcium, Vitamin D, regular exercise program including cardiovascular and weight bearing exercise. Patient will contact Dr. Burna Mortimer about her IBS.  I invited her to call me for a referral if needed.  Follow up annually and prn.      After visit summary provided.

## 2020-03-15 ENCOUNTER — Encounter: Payer: Self-pay | Admitting: Obstetrics and Gynecology

## 2020-03-15 ENCOUNTER — Ambulatory Visit (INDEPENDENT_AMBULATORY_CARE_PROVIDER_SITE_OTHER): Payer: Federal, State, Local not specified - PPO | Admitting: Obstetrics and Gynecology

## 2020-03-15 ENCOUNTER — Other Ambulatory Visit (HOSPITAL_COMMUNITY)
Admission: RE | Admit: 2020-03-15 | Discharge: 2020-03-15 | Disposition: A | Discharge status: Home / Self Care | Payer: Federal, State, Local not specified - PPO | Source: Ambulatory Visit | Attending: Obstetrics and Gynecology | Admitting: Obstetrics and Gynecology

## 2020-03-15 ENCOUNTER — Other Ambulatory Visit: Payer: Self-pay

## 2020-03-15 VITALS — BP 110/58 | HR 64 | Resp 12 | Ht 65.0 in | Wt 145.0 lb

## 2020-03-15 DIAGNOSIS — Z87410 Personal history of cervical dysplasia: Secondary | ICD-10-CM

## 2020-03-15 DIAGNOSIS — Z8619 Personal history of other infectious and parasitic diseases: Secondary | ICD-10-CM | POA: Diagnosis present

## 2020-03-15 DIAGNOSIS — Z01419 Encounter for gynecological examination (general) (routine) without abnormal findings: Secondary | ICD-10-CM | POA: Diagnosis not present

## 2020-03-15 NOTE — Patient Instructions (Signed)

## 2020-03-17 LAB — CYTOLOGY - PAP
Comment: NEGATIVE
Diagnosis: NEGATIVE
High risk HPV: NEGATIVE

## 2020-03-24 ENCOUNTER — Telehealth: Payer: Self-pay | Admitting: Obstetrics and Gynecology

## 2020-03-24 DIAGNOSIS — Z13820 Encounter for screening for osteoporosis: Secondary | ICD-10-CM

## 2020-03-24 NOTE — Telephone Encounter (Signed)
[facility]

## 2020-03-24 NOTE — Telephone Encounter (Signed)
AEX 03/15/20 with Dr Quincy Simmonds.  Last BMD 2010 in [place] per pt.   Spoke with pt. Pt states needing order for BMD at [facility]. Pt states last BMD in 2010 in [place]. Pt states does not remember name of facility for records.  Pt advised that Dr Quincy Simmonds not in office today, but to call after 9/27 to schedule BMD at [facility]. Pt agreeable and verbalized understanding.   BMD order pending per Dr Elza Rafter review  Routing to Dr Quincy Simmonds.

## 2020-03-26 ENCOUNTER — Other Ambulatory Visit: Payer: Self-pay | Admitting: Obstetrics and Gynecology

## 2020-03-26 DIAGNOSIS — Z780 Asymptomatic menopausal state: Secondary | ICD-10-CM

## 2020-03-26 NOTE — Telephone Encounter (Signed)
I placed on order for a bone density.

## 2020-03-27 NOTE — Telephone Encounter (Signed)
Left pt detailed message with orders placed for BMD at [facility]. Ok per [employer]. Pt to return call with any questions or concerns.  Encounter closed.

## 2020-05-31 ENCOUNTER — Ambulatory Visit: Payer: Federal, State, Local not specified - PPO | Admitting: Gastroenterology

## 2020-07-10 ENCOUNTER — Ambulatory Visit
Admission: RE | Admit: 2020-07-10 | Discharge: 2020-07-10 | Disposition: A | Discharge status: Home / Self Care | Payer: Federal, State, Local not specified - PPO | Source: Ambulatory Visit | Attending: Obstetrics and Gynecology | Admitting: Obstetrics and Gynecology

## 2020-07-10 ENCOUNTER — Other Ambulatory Visit: Payer: Self-pay

## 2020-07-10 DIAGNOSIS — Z780 Asymptomatic menopausal state: Secondary | ICD-10-CM

## 2021-01-23 ENCOUNTER — Other Ambulatory Visit: Payer: Self-pay | Admitting: Obstetrics and Gynecology

## 2021-01-23 DIAGNOSIS — Z1231 Encounter for screening mammogram for malignant neoplasm of breast: Secondary | ICD-10-CM

## 2021-03-19 ENCOUNTER — Ambulatory Visit: Payer: Federal, State, Local not specified - PPO | Admitting: Obstetrics and Gynecology

## 2021-03-28 ENCOUNTER — Other Ambulatory Visit: Payer: Self-pay

## 2021-03-28 ENCOUNTER — Ambulatory Visit
Admission: RE | Admit: 2021-03-28 | Discharge: 2021-03-28 | Disposition: A | Discharge status: Home / Self Care | Payer: Federal, State, Local not specified - PPO | Source: Ambulatory Visit

## 2021-03-28 DIAGNOSIS — Z1231 Encounter for screening mammogram for malignant neoplasm of breast: Secondary | ICD-10-CM

## 2021-04-12 ENCOUNTER — Other Ambulatory Visit: Payer: Self-pay

## 2021-04-12 ENCOUNTER — Ambulatory Visit (INDEPENDENT_AMBULATORY_CARE_PROVIDER_SITE_OTHER): Payer: Federal, State, Local not specified - PPO | Admitting: Obstetrics and Gynecology

## 2021-04-12 ENCOUNTER — Encounter: Payer: Self-pay | Admitting: Obstetrics and Gynecology

## 2021-04-12 VITALS — HR 70 | Ht 65.5 in | Wt 125.0 lb

## 2021-04-12 DIAGNOSIS — Z01419 Encounter for gynecological examination (general) (routine) without abnormal findings: Secondary | ICD-10-CM | POA: Diagnosis not present

## 2021-04-12 NOTE — Patient Instructions (Signed)

## 2021-04-12 NOTE — Progress Notes (Signed)
65 y.o. G56P1001 Married Caucasian female here for annual exam.    Lost 20lbs!!!  Patient had gum graft surgery yesterday for gum recession.   Wants general blood work today.  She declines a blood pressure check.   Would like me to sign a health attestation form so she can travel to [place] on 06/22/21.   PCP:  Looking for New MD  Patient's last menstrual period was 07/02/2007 (approximate).           Sexually active: No.  The current method of family planning is post menopausal status.    Exercising: Yes.     Walks 5 miles daily/weights Smoker:  no  Health Maintenance: Pap:  03-15-20 Neg:Neg HR HPV, 02/28/17 Neg:Neg HR HPV, 02/06/15 - neg, neg HR HPV.  Pap 2015 normal per patient. History of abnormal Pap:  yes, Hx of colposcopy/cryotherapy to cervix 1997 in [place].  Pt. Also states hx of dysplastic paps from 2006-2009 with colposcopies but no treatment to  MMG:  03-28-21 3D/Neg/BiRads1 Colonoscopy:  03-13-18 polyps;next 5 years BMD:  07-10-20  Result :Osteopenia TDaP: PCP Gardasil:   no HIV::02-22-16 NR Hep C:02-22-16 Neg Screening Labs:  today   reports that she has quit smoking. She has never used smokeless tobacco. She reports current alcohol use of about 2.0 standard drinks per week. She reports that she does not use drugs.  Past Medical History:  Diagnosis Date   Abnormal Pap smear of cervix 1997, 2006-2009   --1997 pt. had colpo/cryotherapy to cervix in [place], [place], 2006-2009 abn.paps(dysplasia) and colposcopies but no treatment to cervix--paps returned to normal   Arthritis    lumbarspine   Cataract    bilateral (early)   Genital warts 1984   Kidney stones    Scoliosis    STD (sexually transmitted disease)    Hx condyloma    Past Surgical History:  Procedure Laterality Date   [place]   done in [place], [place] Bilateral 2015   [place]   d/t back pain--nl--in [place], [place]    Current  Outpatient Medications  Medication Sig Dispense Refill   diclofenac sodium (VOLTAREN) 1 % GEL Apply 2 g topically 4 (four) times daily. 1 Tube 5   ibuprofen (ADVIL) 800 MG tablet Take 1 tablet (800 mg total) by mouth every 8 (eight) hours as needed. 30 tablet 2   Multiple Vitamins-Minerals (ZINC PO) Take 1 tablet by mouth daily.     VITAMIN D PO Take 1 tablet by mouth daily.     No current facility-administered medications for this visit.    Family History  Problem Relation Age of Onset   Heart attack Father    Diabetes Father    Other Father        vascular/cluster H/As   Stroke Father    Hyperlipidemia Brother    Colonic polyp Mother        11/2018   Stroke Mother    Breast cancer Neg Hx     Review of Systems  All other systems reviewed and are negative.  Exam:   Pulse 70   Ht 5' 5.5" (1.664 m)   Wt 125 lb (56.7 kg)   LMP 07/02/2007 (Approximate)   SpO2 100%   BMI 20.48 kg/m     General appearance: alert, cooperative and appears stated age Head: normocephalic, without obvious abnormality, atraumatic Neck: no adenopathy, supple, symmetrical, trachea midline and thyroid normal to inspection  and palpation Lungs: clear to auscultation bilaterally Breasts: normal appearance, no masses or tenderness, No nipple retraction or dimpling, No nipple discharge or bleeding, No axillary adenopathy Heart: regular rate and rhythm Abdomen: soft, non-tender; no masses, no organomegaly Extremities: extremities normal, atraumatic, no cyanosis or edema Skin: skin color, texture, turgor normal. No rashes or lesions Lymph nodes: cervical, supraclavicular, and axillary nodes normal. Neurologic: grossly normal  Pelvic: External genitalia:  no lesions              No abnormal inguinal nodes palpated.              Urethra:  normal appearing urethra with no masses, tenderness or lesions              Bartholins and Skenes: normal                 Vagina: normal appearing vagina with normal color  and discharge, no lesions              Cervix: no lesions              Pap taken: no Bimanual Exam:  Uterus:  normal size, contour, position, consistency, mobility, non-tender              Adnexa: no mass, fullness, tenderness              Rectal exam: yes.  Confirms.              Anus:  normal sphincter tone, no lesions  Chaperone was present for exam:  Estill Bamberg, CMA  Assessment:   Well woman visit with gynecologic exam. Hx cervical dysplasia and positive HR HPV.  IBS. Osteopenia.  Low risk of fracture by FRAX.   Plan: Mammogram screening discussed. Self breast awareness reviewed. Pap and HR HPV 2024. Guidelines for Calcium, Vitamin D, regular exercise program including cardiovascular and weight bearing exercise. BMD in 2024.  Routine labs today.  She will leave her health forms at the front desk to be registered and then filled out.  Follow up annually and prn.    After visit summary provided.

## 2021-04-13 LAB — COMPREHENSIVE METABOLIC PANEL
AG Ratio: 1.8 (calc) (ref 1.0–2.5)
ALT: 20 U/L (ref 6–29)
AST: 24 U/L (ref 10–35)
Albumin: 4.6 g/dL (ref 3.6–5.1)
Alkaline phosphatase (APISO): 55 U/L (ref 37–153)
BUN: 15 mg/dL (ref 7–25)
CO2: 28 mmol/L (ref 20–32)
Calcium: 10 mg/dL (ref 8.6–10.4)
Chloride: 102 mmol/L (ref 98–110)
Creat: 0.78 mg/dL (ref 0.50–1.05)
Globulin: 2.5 g/dL (calc) (ref 1.9–3.7)
Glucose, Bld: 88 mg/dL (ref 65–99)
Potassium: 4.2 mmol/L (ref 3.5–5.3)
Sodium: 138 mmol/L (ref 135–146)
Total Bilirubin: 0.5 mg/dL (ref 0.2–1.2)
Total Protein: 7.1 g/dL (ref 6.1–8.1)

## 2021-04-13 LAB — CBC
HCT: 40.5 % (ref 35.0–45.0)
Hemoglobin: 13.6 g/dL (ref 11.7–15.5)
MCH: 32.2 pg (ref 27.0–33.0)
MCHC: 33.6 g/dL (ref 32.0–36.0)
MCV: 95.7 fL (ref 80.0–100.0)
MPV: 10.7 fL (ref 7.5–12.5)
Platelets: 258 10*3/uL (ref 140–400)
RBC: 4.23 10*6/uL (ref 3.80–5.10)
RDW: 11.6 % (ref 11.0–15.0)
WBC: 4.2 10*3/uL (ref 3.8–10.8)

## 2021-04-13 LAB — LIPID PANEL
Cholesterol: 228 mg/dL — ABNORMAL HIGH (ref <200)
HDL: 119 mg/dL (ref >=50)
LDL Cholesterol (Calc): 95 mg/dL (calc)
Non-HDL Cholesterol (Calc): 109 mg/dL (calc) (ref <130)
Total CHOL/HDL Ratio: 1.9 (calc) (ref <5.0)
Triglycerides: 62 mg/dL (ref <150)

## 2021-04-13 LAB — TSH: TSH: 2.11 mIU/L (ref 0.40–4.50)

## 2021-04-13 LAB — VITAMIN D 25 HYDROXY (VIT D DEFICIENCY, FRACTURES): Vit D, 25-Hydroxy: 73 ng/mL (ref 30–100)

## 2021-05-11 ENCOUNTER — Other Ambulatory Visit (HOSPITAL_BASED_OUTPATIENT_CLINIC_OR_DEPARTMENT_OTHER): Payer: Self-pay

## 2021-05-11 MED ORDER — FLUARIX QUADRIVALENT 0.5 ML IM SUSY
PREFILLED_SYRINGE | INTRAMUSCULAR | 0 refills | Status: DC
  Filled 2021-05-11: qty 0.5, 1d supply, fill #0

## 2021-05-25 ENCOUNTER — Other Ambulatory Visit (HOSPITAL_BASED_OUTPATIENT_CLINIC_OR_DEPARTMENT_OTHER): Payer: Self-pay

## 2021-05-25 ENCOUNTER — Other Ambulatory Visit: Payer: Self-pay

## 2021-05-25 ENCOUNTER — Ambulatory Visit: Payer: Federal, State, Local not specified - PPO | Attending: Internal Medicine

## 2021-05-25 DIAGNOSIS — Z23 Encounter for immunization: Secondary | ICD-10-CM

## 2021-05-25 MED ORDER — PFIZER COVID-19 VAC BIVALENT 30 MCG/0.3ML IM SUSP
INTRAMUSCULAR | 0 refills | Status: DC
  Filled 2021-05-25: qty 0.3, 1d supply, fill #0

## 2021-05-25 NOTE — Progress Notes (Signed)
   Covid-19 Vaccination Clinic  Name:  Brenda Dyer    MRN: [id number] DOB: 1956-05-21  05/25/2021  [name] was observed post Covid-19 immunization for 15 minutes without incident. She was provided with Vaccine Information Sheet and instruction to access the V-Safe system.   [name] was instructed to call 911 with any severe reactions post vaccine: Difficulty breathing  Swelling of face and throat  A fast heartbeat  A bad rash all over body  Dizziness and weakness   Immunizations Administered     Name Date Dose VIS Date Route   Pfizer Covid-19 Vaccine Bivalent Booster 05/25/2021  9:57 AM 0.3 mL 02/28/2021 Intramuscular   Manufacturer: [place]   Lot: [id number]   [place]: [phone]

## 2022-02-13 ENCOUNTER — Other Ambulatory Visit: Payer: Self-pay | Admitting: Obstetrics and Gynecology

## 2022-02-13 DIAGNOSIS — Z1231 Encounter for screening mammogram for malignant neoplasm of breast: Secondary | ICD-10-CM

## 2022-04-15 ENCOUNTER — Ambulatory Visit: Payer: Federal, State, Local not specified - PPO | Admitting: Obstetrics and Gynecology

## 2022-04-24 ENCOUNTER — Ambulatory Visit
Admission: RE | Admit: 2022-04-24 | Discharge: 2022-04-24 | Disposition: A | Discharge status: Home / Self Care | Payer: Federal, State, Local not specified - PPO | Source: Ambulatory Visit

## 2022-04-24 DIAGNOSIS — Z1231 Encounter for screening mammogram for malignant neoplasm of breast: Secondary | ICD-10-CM

## 2022-05-09 ENCOUNTER — Ambulatory Visit (INDEPENDENT_AMBULATORY_CARE_PROVIDER_SITE_OTHER): Payer: Federal, State, Local not specified - PPO

## 2022-05-09 ENCOUNTER — Ambulatory Visit: Payer: Self-pay

## 2022-05-09 ENCOUNTER — Ambulatory Visit: Payer: Federal, State, Local not specified - PPO | Admitting: Orthopaedic Surgery

## 2022-05-09 ENCOUNTER — Encounter: Payer: Self-pay | Admitting: Orthopaedic Surgery

## 2022-05-09 DIAGNOSIS — M79672 Pain in left foot: Secondary | ICD-10-CM

## 2022-05-09 DIAGNOSIS — M79671 Pain in right foot: Secondary | ICD-10-CM

## 2022-05-09 MED ORDER — BETAMETHASONE SOD PHOS & ACET 6 (3-3) MG/ML IJ SUSP
6.0000 mg | INTRAMUSCULAR | Status: AC | PRN
  Administered 2022-05-09: 6 mg via INTRA_ARTICULAR

## 2022-05-09 MED ORDER — LIDOCAINE HCL 1 % IJ SOLN
1.0000 mL | INTRAMUSCULAR | Status: AC | PRN
  Administered 2022-05-09: 1 mL

## 2022-05-09 NOTE — Progress Notes (Signed)
Office Visit Note   Patient: Brenda Dyer           Date of Birth: 04-Jul-1955           MRN: [id number] Visit Date: 05/09/2022              Requested by: Brenda Cobbs, MD [place],  [place] [id number] PCP: Brenda Cobbs, MD   Assessment & Plan: Visit Diagnoses:  1. Pain in left foot   2. Pain in right foot     Plan: Impression is bilateral plantar second toe pain.  This is a bit unclear as to the specific etiology but possible conditions were discussed.  Does not sound like it is a stress fracture.  We discussed trying a carbon footplate to limit range of motion and allow rest while walking.  Based on options she would like to try bilateral intra-articular injections.  We sent her to Dr. Rolena Dyer for these to be done under ultrasound guidance.  Follow-up if symptoms persist.  Follow-Up Instructions: No follow-ups on file.   Orders:  Orders Placed This Encounter  Procedures   XR Foot Complete Left   XR Foot Complete Right   No orders of the defined types were placed in this encounter.     Procedures: No procedures performed   Clinical Data: No additional findings.   Subjective: Chief Complaint  Patient presents with   Right Foot - Pain   Left Foot - Pain    HPI Brenda Dyer is a 66 year old female who was last seen in our office in 2021.  She comes in with 3 months pain to the bottom of the second toe bilaterally.  She feels under the second metatarsal head.  Not exactly sure if she may have injured it while she was wearing a pair of bad shoes.  She does walk a lot for exercise.  She has tried Voltaren gel as well as ibuprofen.  Denies any neurologic symptoms.  Review of Systems  Constitutional: Negative.   HENT: Negative.    Eyes: Negative.   Respiratory: Negative.    Cardiovascular: Negative.   Endocrine: Negative.   Musculoskeletal: Negative.   Neurological: Negative.   Hematological: Negative.    Psychiatric/Behavioral: Negative.    All other systems reviewed and are negative.    Objective: Vital Signs: LMP 07/02/2007 (Approximate)   Physical Exam Vitals and nursing note reviewed.  Constitutional:      Appearance: She is well-developed.  HENT:     Head: Atraumatic.     Nose: Nose normal.  Eyes:     Extraocular Movements: Extraocular movements intact.  Cardiovascular:     Pulses: Normal pulses.  Pulmonary:     Effort: Pulmonary effort is normal.  Abdominal:     Palpations: Abdomen is soft.  Musculoskeletal:     Cervical back: Neck supple.  Skin:    General: Skin is warm.     Capillary Refill: Capillary refill takes less than 2 seconds.  Neurological:     Mental Status: She is alert. Mental status is at baseline.  Psychiatric:        Behavior: Behavior normal.        Thought Content: Thought content normal.        Judgment: Judgment normal.     Ortho Exam Examination of bilateral feet showed slight tenderness to the plantar aspect of the second metatarsal heads.  She has no webspace tenderness.  Negative Mulder's  click.  Range of motion of the second MTP joint is normal and without pain.  Normal sensation.  Normal capillary refill. Specialty Comments:  No specialty comments available.  Imaging: No results found.   PMFS History: Patient Active Problem List   Diagnosis Date Noted   METATARSALGIA 07/12/2010   ABNORMALITY OF GAIT 07/12/2010   CAVUS DEFORMITY OF FOOT, ACQUIRED 07/21/2008   UNEQUAL LEG LENGTH 07/21/2008   SHIN SPLINTS 07/21/2008   Past Medical History:  Diagnosis Date   Abnormal Pap smear of cervix 1997, 2006-2009   --1997 pt. had colpo/cryotherapy to cervix in [place], [place], 2006-2009 abn.paps(dysplasia) and colposcopies but no treatment to cervix--paps returned to normal   Arthritis    lumbarspine   Cataract    bilateral (early)   Genital warts 1984   Kidney stones    Scoliosis    STD (sexually transmitted disease)    Hx condyloma     Family History  Problem Relation Age of Onset   Heart attack Father    Diabetes Father    Other Father        vascular/cluster H/As   Stroke Father    Hyperlipidemia Brother    Colonic polyp Mother        11/2018   Stroke Mother    Breast cancer Neg Hx     Past Surgical History:  Procedure Laterality Date   [place]   done in [place], [place] Bilateral 2015   [place]   d/t back pain--nl--in [place], [place] History   Occupational History   Not on file  Tobacco Use   Smoking status: Former   Smokeless tobacco: Never  Vaping Use   Vaping Use: Never used  Substance and Sexual Activity   Alcohol use: Yes    Alcohol/week: 2.0 standard drinks of alcohol    Types: 2 Standard drinks or equivalent per week   Drug use: No   Sexual activity: Not Currently    Partners: Male    Birth control/protection: Post-menopausal

## 2022-05-09 NOTE — Progress Notes (Signed)
   Procedure Note  Patient: Brenda Dyer             Date of Birth: 1956/02/19           MRN: [id number]             Visit Date: 05/09/2022  Procedures: Visit Diagnoses:  1. Pain in left foot   2. Pain in right foot    Small Joint Inj: bilateral second MTP on 05/09/2022 11:13 AM Indications: pain and diagnostic evaluation Details: 25 G needle, ultrasound-guided dorsal approach  Spinal Needle: No  Medications (Right): 1 mL lidocaine 1 %; 6 mg betamethasone acetate-betamethasone sodium phosphate 6 (3-3) MG/ML Medications (Left): 1 mL lidocaine 1 %; 6 mg betamethasone acetate-betamethasone sodium phosphate 6 (3-3) MG/ML Outcome: tolerated well, no immediate complications  US-guided 2nd MTP Joint injection, bilateral: - Dorsal-approach - Hockey-stick probe used - Needle: 25-gauge, 1"  - Injection technique: long-axis, out-of-plane, walk-down approach  *Technically successful US-guided injections Procedure, treatment alternatives, risks and benefits explained, specific risks discussed. Consent was given by the patient. Immediately prior to procedure a time out was called to verify the correct patient, procedure, equipment, support staff and site/side marked as required. Patient was prepped and draped in the usual sterile fashion.         Elba Barman, DO Primary Care Sports Medicine Physician  [place]  This note was dictated using Dragon naturally speaking software and may contain errors in syntax, spelling, or content which have not been identified prior to signing this note.

## 2022-05-09 NOTE — Patient Instructions (Signed)
How to place metatarsal pad:  [email or web address]

## 2022-05-14 ENCOUNTER — Ambulatory Visit (INDEPENDENT_AMBULATORY_CARE_PROVIDER_SITE_OTHER): Payer: Federal, State, Local not specified - PPO | Admitting: Obstetrics and Gynecology

## 2022-05-14 ENCOUNTER — Encounter: Payer: Self-pay | Admitting: Obstetrics and Gynecology

## 2022-05-14 VITALS — BP 118/80 | HR 78 | Ht 66.0 in | Wt 125.0 lb

## 2022-05-14 DIAGNOSIS — Z780 Asymptomatic menopausal state: Secondary | ICD-10-CM

## 2022-05-14 DIAGNOSIS — Z0000 Encounter for general adult medical examination without abnormal findings: Secondary | ICD-10-CM

## 2022-05-14 DIAGNOSIS — M8580 Other specified disorders of bone density and structure, unspecified site: Secondary | ICD-10-CM

## 2022-05-14 DIAGNOSIS — Z01419 Encounter for gynecological examination (general) (routine) without abnormal findings: Secondary | ICD-10-CM | POA: Diagnosis not present

## 2022-05-14 NOTE — Progress Notes (Signed)
66 y.o. G36P1001 Married Caucasian female here for annual exam.    Wants blood work today.  Takes magnesium to help treat constipation.   Asking me what abdominal structure she can feel in her left lower abdomen.  Traveled to [place] and [place].   PCP:   Maurice Small, MD  Patient's last menstrual period was 07/02/2007 (approximate).           Sexually active: No.  The current method of family planning is post menopausal status.    Exercising: Yes.    Gym/ health club routine includes cardio and mod to heavy weightlifting. Smoker:  no  Health Maintenance: Pap:   03-15-20 Neg:Neg HR HPV, 02/28/17 Neg:Neg HR HPV, 02/06/15 - neg, neg HR HPV.  Pap 2015 normal per patient.   History of abnormal Pap:  yes, Hx of colposcopy/cryotherapy to cervix 1997 in [place].  Pt. Also states hx of dysplastic paps from 2006-2009 with colposcopies but no treatment   MMG:  03-28-21 3D/Neg/BiRads1, 04/24/22 - Bi_RADS1, cat C density. Colonoscopy:  03/13/18, polyps BMD:   07/10/20   Result  osteopenic TDaP:  PCP Gardasil:   no HIV: 02/22/16 NR Hep C: 02/22/16 neg Screening Labs:  today Flu vaccine:  today. Covid vaccine:  discussed.   reports that she has quit smoking. She has never used smokeless tobacco. She reports current alcohol use of about 2.0 standard drinks of alcohol per week. She reports that she does not use drugs.  Past Medical History:  Diagnosis Date   Abnormal Pap smear of cervix 1997, 2006-2009   --1997 pt. had colpo/cryotherapy to cervix in [place], [place], 2006-2009 abn.paps(dysplasia) and colposcopies but no treatment to cervix--paps returned to normal   Arthritis    lumbarspine   Cataract    bilateral (early)   Genital warts 1984   Kidney stones    Scoliosis    STD (sexually transmitted disease)    Hx condyloma    Past Surgical History:  Procedure Laterality Date   [place]   done in [place], [place] Bilateral 2015    [place]   d/t back pain--nl--in [place], [place]    Current Outpatient Medications  Medication Sig Dispense Refill   COVID-19 mRNA bivalent vaccine, Pfizer, (PFIZER COVID-19 VAC BIVALENT) injection Inject into the muscle. 0.3 mL 0   diclofenac sodium (VOLTAREN) 1 % GEL Apply 2 g topically 4 (four) times daily. 1 Tube 5   ibuprofen (ADVIL) 800 MG tablet Take 1 tablet (800 mg total) by mouth every 8 (eight) hours as needed. 30 tablet 2   influenza vac split quadrivalent PF (FLUARIX QUADRIVALENT) 0.5 ML injection Inject into the muscle. 0.5 mL 0   Multiple Vitamins-Minerals (ZINC PO) Take 1 tablet by mouth daily.     VITAMIN D PO Take 1 tablet by mouth daily.     No current facility-administered medications for this visit.    Family History  Problem Relation Age of Onset   Heart attack Father    Diabetes Father    Other Father        vascular/cluster H/As   Stroke Father    Hyperlipidemia Brother    Colonic polyp Mother        11/2018   Stroke Mother    Breast cancer Neg Hx     Review of Systems  See HPI.   Exam:   BP 118/80 (BP Location: Left Arm, Patient Position: Sitting, Cuff  Size: Normal)   Pulse 78   Ht '5\' 6"'$  (1.676 m)   Wt 125 lb (56.7 kg)   LMP 07/02/2007 (Approximate)   BMI 20.18 kg/m     General appearance: alert, cooperative and appears stated age Head: normocephalic, without obvious abnormality, atraumatic Neck: no adenopathy, supple, symmetrical, trachea midline and thyroid normal to inspection and palpation Lungs: clear to auscultation bilaterally Breasts: normal appearance, no masses or tenderness, No nipple retraction or dimpling, No nipple discharge or bleeding, No axillary adenopathy Heart: regular rate and rhythm Abdomen: soft, non-tender; no masses, no organomegaly.  Tubular nontender structure left lateral abdomen consistent with colon.  Extremities: extremities normal, atraumatic, no cyanosis or edema Skin: skin color, texture, turgor  normal. No rashes or lesions Lymph nodes: cervical, supraclavicular, and axillary nodes normal. Neurologic: grossly normal  Pelvic: External genitalia:  no lesions              No abnormal inguinal nodes palpated.              Urethra:  normal appearing urethra with no masses, tenderness or lesions              Bartholins and Skenes: normal                 Vagina: normal appearing vagina with normal color and discharge, no lesions              Cervix: no lesions              Pap taken: no Bimanual Exam:  Uterus:  normal size, contour, position, consistency, mobility, non-tender              Adnexa: no mass, fullness, tenderness              Rectal exam: yes.  Confirms.              Anus:  normal sphincter tone, no lesions  Chaperone was present for exam:  Kimalexis.   Assessment:   Well woman visit with gynecologic exam. Hx cervical dysplasia and positive HR HPV in past followed by normal paps and negative HR HPV. Osteopenia.  Low risk of fracture by FRAX.  Scoliosis.  Menopausal female.   Plan: Mammogram screening discussed. Self breast awareness reviewed. Pap and HR HPV not indicated.  Guidelines for Calcium, Vitamin D, regular exercise program including cardiovascular and weight bearing exercise. Flu vaccine.  Routine labs today.  BMD in January, 2024.  Follow up annually and prn.   After visit summary provided.

## 2022-05-14 NOTE — Patient Instructions (Signed)

## 2022-05-15 LAB — COMPREHENSIVE METABOLIC PANEL
AG Ratio: 1.9 (calc) (ref 1.0–2.5)
ALT: 21 U/L (ref 6–29)
AST: 20 U/L (ref 10–35)
Albumin: 4.7 g/dL (ref 3.6–5.1)
Alkaline phosphatase (APISO): 47 U/L (ref 37–153)
BUN: 22 mg/dL (ref 7–25)
CO2: 32 mmol/L (ref 20–32)
Calcium: 9.9 mg/dL (ref 8.6–10.4)
Chloride: 100 mmol/L (ref 98–110)
Creat: 0.94 mg/dL (ref 0.50–1.05)
Globulin: 2.5 g/dL (calc) (ref 1.9–3.7)
Glucose, Bld: 87 mg/dL (ref 65–99)
Potassium: 3.9 mmol/L (ref 3.5–5.3)
Sodium: 138 mmol/L (ref 135–146)
Total Bilirubin: 0.5 mg/dL (ref 0.2–1.2)
Total Protein: 7.2 g/dL (ref 6.1–8.1)

## 2022-05-15 LAB — CBC
HCT: 39.6 % (ref 35.0–45.0)
Hemoglobin: 13.4 g/dL (ref 11.7–15.5)
MCH: 31.9 pg (ref 27.0–33.0)
MCHC: 33.8 g/dL (ref 32.0–36.0)
MCV: 94.3 fL (ref 80.0–100.0)
MPV: 10.7 fL (ref 7.5–12.5)
Platelets: 287 10*3/uL (ref 140–400)
RBC: 4.2 10*6/uL (ref 3.80–5.10)
RDW: 11.8 % (ref 11.0–15.0)
WBC: 5.7 10*3/uL (ref 3.8–10.8)

## 2022-05-15 LAB — LIPID PANEL
Cholesterol: 241 mg/dL — ABNORMAL HIGH (ref <200)
HDL: 119 mg/dL (ref >=50)
LDL Cholesterol (Calc): 109 mg/dL (calc) — ABNORMAL HIGH
Non-HDL Cholesterol (Calc): 122 mg/dL (calc) (ref <130)
Total CHOL/HDL Ratio: 2 (calc) (ref <5.0)
Triglycerides: 52 mg/dL (ref <150)

## 2022-05-15 LAB — TSH: TSH: 1.35 mIU/L (ref 0.40–4.50)

## 2022-05-15 LAB — VITAMIN D 25 HYDROXY (VIT D DEFICIENCY, FRACTURES): Vit D, 25-Hydroxy: 58 ng/mL (ref 30–100)

## 2022-06-08 IMAGING — MG MM DIGITAL SCREENING BILAT W/ TOMO AND CAD
8 series · 9 of 24 positions shown · non-contrast
Comparison: Previous exam(s).

CLINICAL DATA: Screening.

EXAM:
DIGITAL SCREENING BILATERAL MAMMOGRAM WITH TOMOSYNTHESIS AND CAD
TECHNIQUE: Bilateral screening digital craniocaudal and mediolateral oblique
mammograms were obtained. Bilateral screening digital breast
tomosynthesis was performed. The images were evaluated with
computer-aided detection.

[R MLO synth-2D]
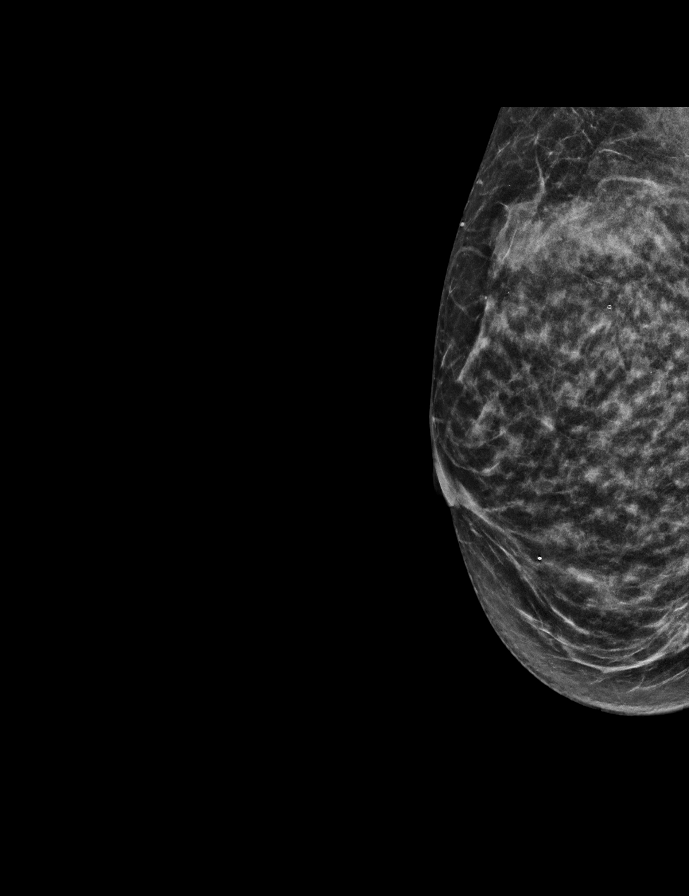

[R CC synth-2D]
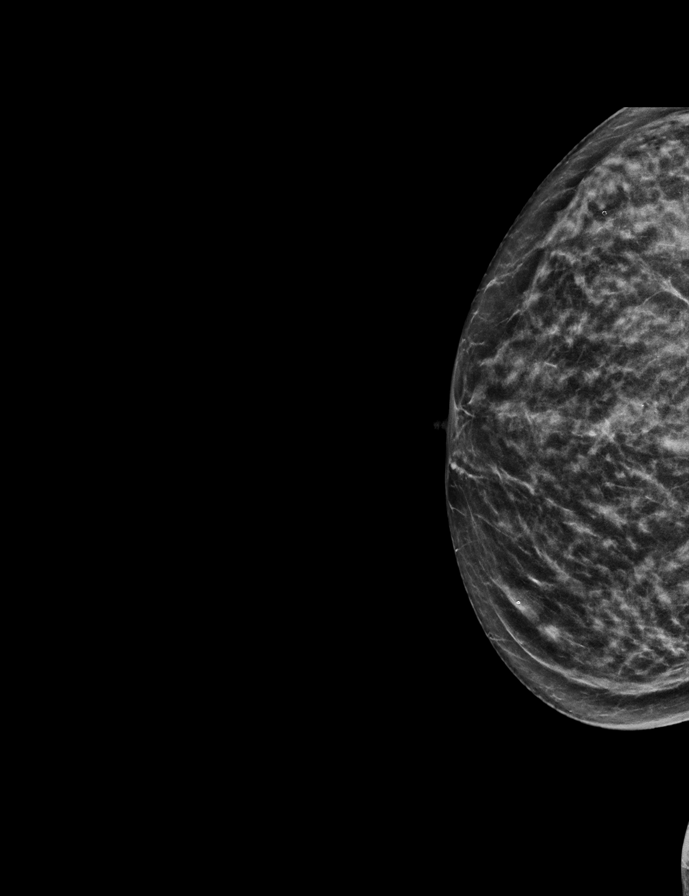

[L MLO synth-2D]
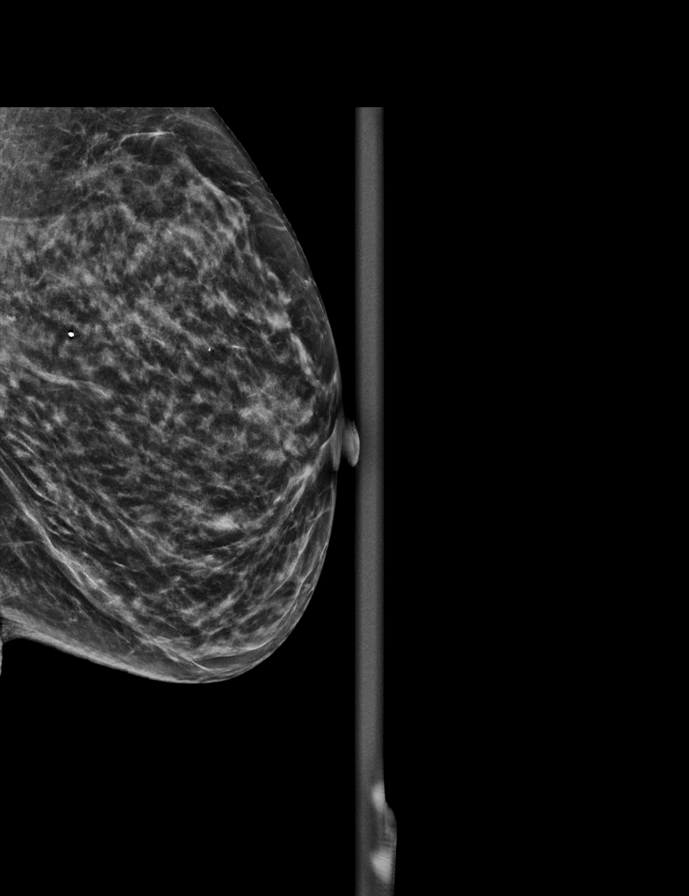

[L CC synth-2D]
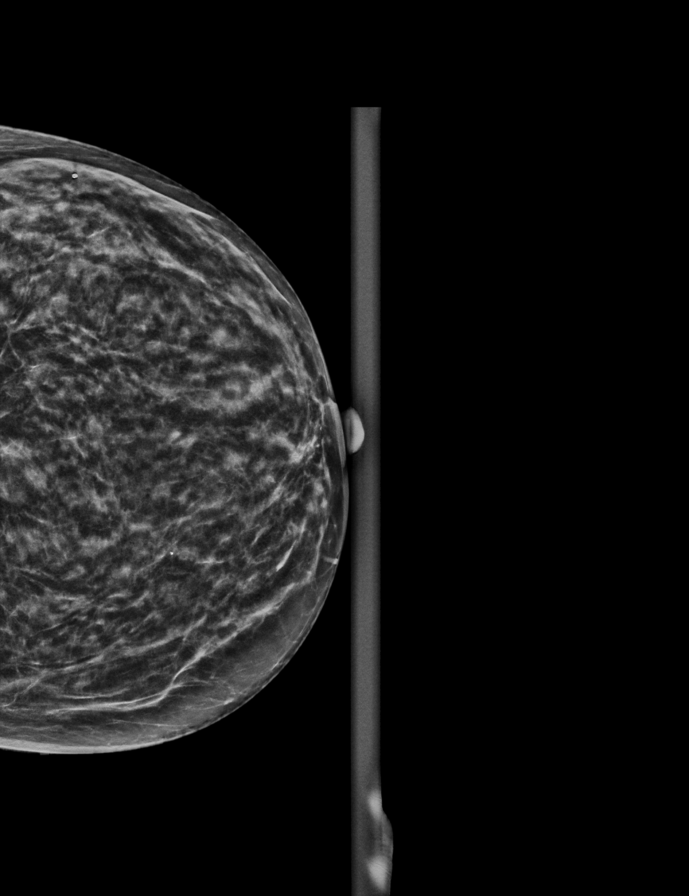

[L MLO tomo · 2 of 49 frames shown]
[frame 16/49]
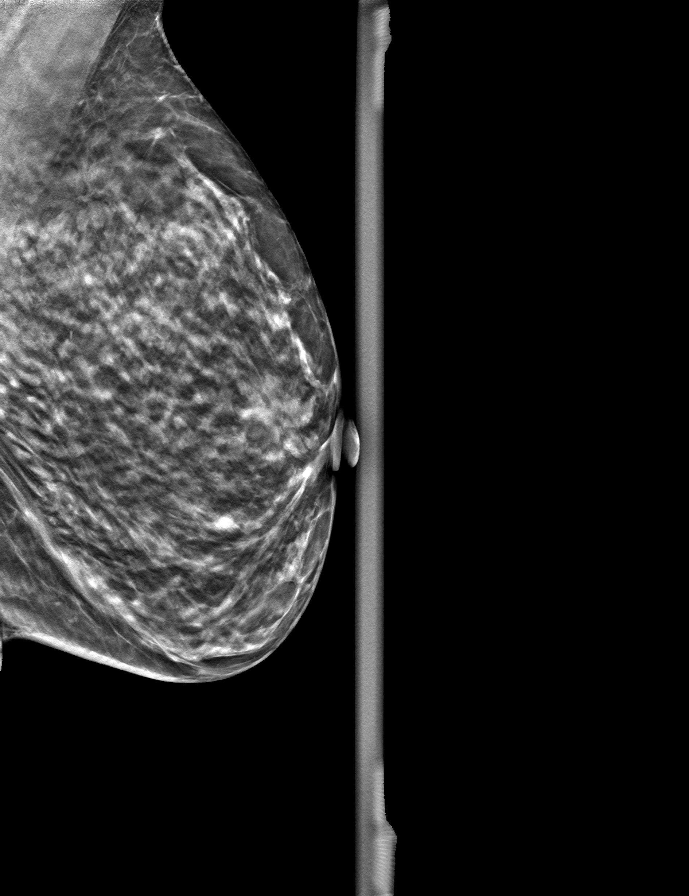
[frame 25/49]
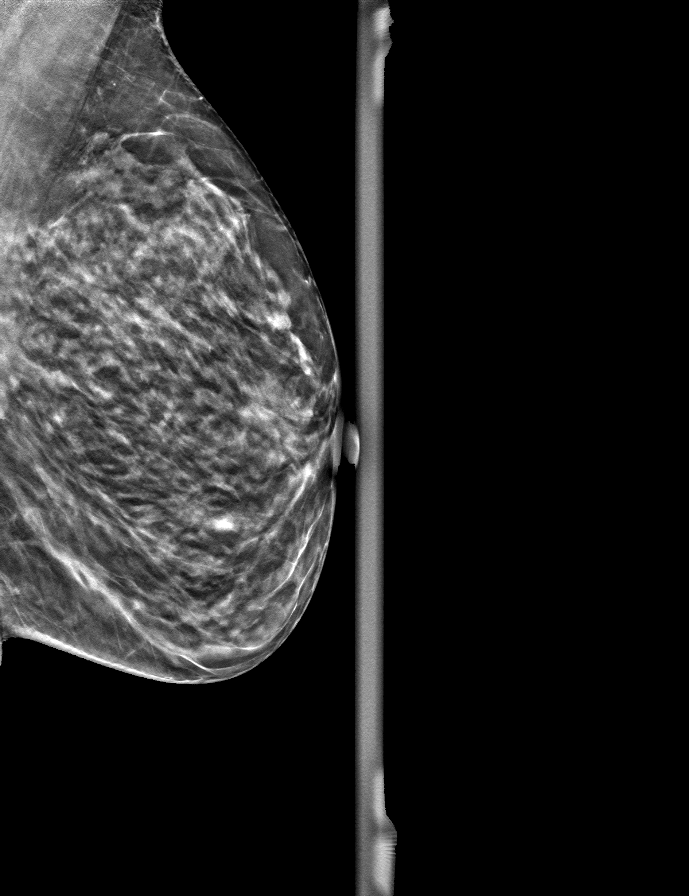

[L CC tomo · tomo slice 23/45.0]
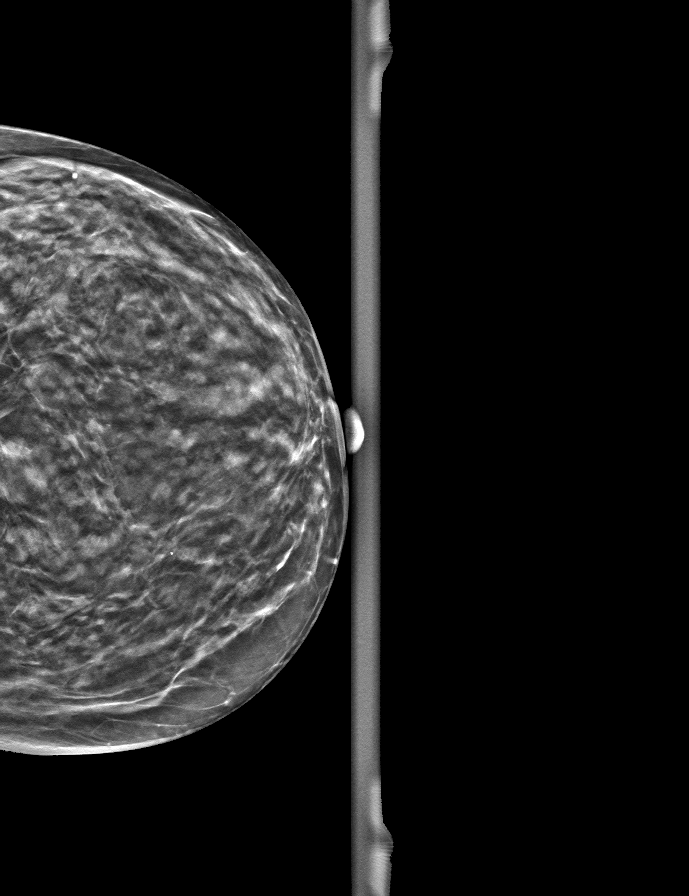

[R MLO tomo · tomo slice 25/49.0]
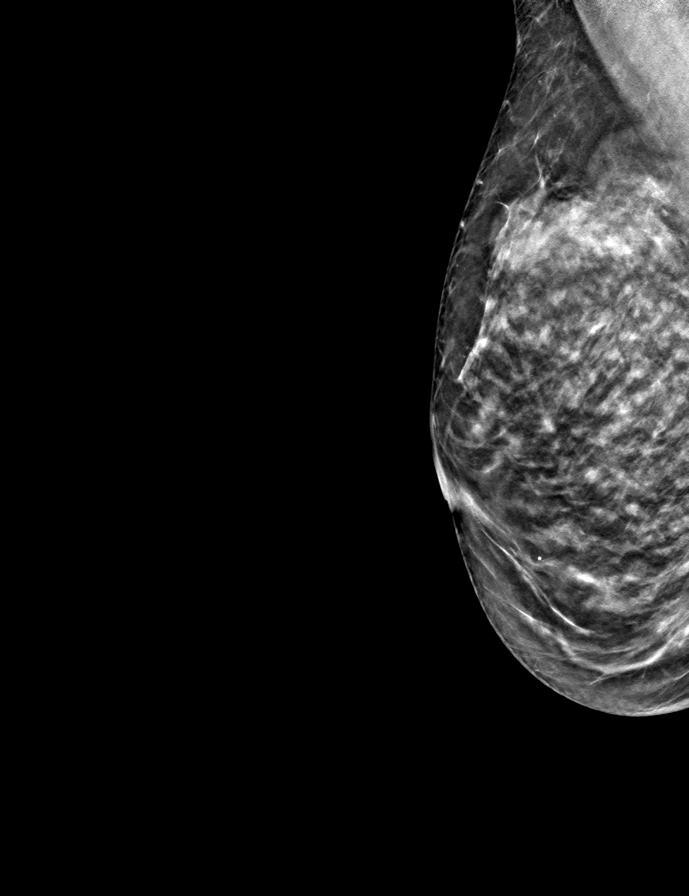

[R CC tomo · tomo slice 25/48.0]
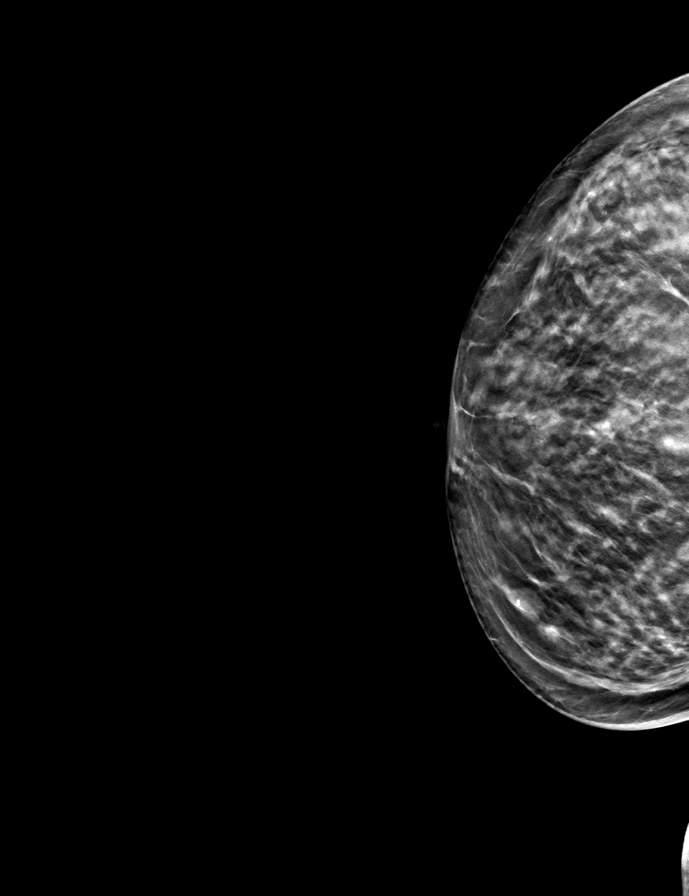

[9 of 24 positions shown; findings below may reference images not displayed]

ACR Breast Density Category c: The breast tissue is heterogeneously
dense, which may obscure small masses.
FINDINGS: There are no findings suspicious for malignancy.
IMPRESSION: No mammographic evidence of malignancy. A result letter of this
screening mammogram will be mailed directly to the patient.

RECOMMENDATION:
Screening mammogram in one year. (Code:Q3-W-BC3)

BI-RADS CATEGORY  1: Negative.

## 2022-06-25 ENCOUNTER — Encounter: Payer: Self-pay | Admitting: Plastic Surgery

## 2022-06-25 ENCOUNTER — Ambulatory Visit (INDEPENDENT_AMBULATORY_CARE_PROVIDER_SITE_OTHER): Payer: Self-pay | Admitting: Plastic Surgery

## 2022-06-25 VITALS — BP 114/77 | HR 63 | Ht 66.0 in | Wt 127.4 lb

## 2022-06-25 DIAGNOSIS — L719 Rosacea, unspecified: Secondary | ICD-10-CM

## 2022-06-25 NOTE — Progress Notes (Signed)
Referring Provider Nunzio Cobbs, MD [address],  [place] [address]   CC:  Chief Complaint  Patient presents with   consult      Brenda Dyer is an 66 y.o. female.  HPI: Brenda Dyer is referred today for possible laser therapy of her rosacea.  She states that she was seen by a dermatologist who prescribed metronidazole and referred her for possible treatment.  She has not used any other therapy or treatment.  She does admit to regular and significant sun exposure without consistent use of sunscreen or hats.  Allergies  Allergen Reactions   Codeine Nausea Only    Outpatient Encounter Medications as of 06/25/2022  Medication Sig   COVID-19 mRNA bivalent vaccine, Pfizer, (PFIZER COVID-19 VAC BIVALENT) injection Inject into the muscle.   diclofenac sodium (VOLTAREN) 1 % GEL Apply 2 g topically 4 (four) times daily.   ibuprofen (ADVIL) 800 MG tablet Take 1 tablet (800 mg total) by mouth every 8 (eight) hours as needed.   influenza vac split quadrivalent PF (FLUARIX QUADRIVALENT) 0.5 ML injection Inject into the muscle.   metroNIDAZOLE (METROCREAM) 0.75 % cream Apply topically.   Multiple Vitamins-Minerals (ZINC PO) Take 1 tablet by mouth daily.   VITAMIN D PO Take 1 tablet by mouth daily.   No facility-administered encounter medications on file as of 06/25/2022.     Past Medical History:  Diagnosis Date   Abnormal Pap smear of cervix 1997, 2006-2009   --1997 pt. had colpo/cryotherapy to cervix in [place], [place], 2006-2009 abn.paps(dysplasia) and colposcopies but no treatment to cervix--paps returned to normal   Arthritis    lumbarspine   Cataract    bilateral (early)   Genital warts 1984   Kidney stones    Scoliosis    STD (sexually transmitted disease)    Hx condyloma    Past Surgical History:  Procedure Laterality Date   [place]   done in [place], [place]  Bilateral 2015   [place]   d/t back pain--nl--in [place], [place]    Family History  Problem Relation Age of Onset   Heart attack Father    Diabetes Father    Other Father        vascular/cluster H/As   Stroke Father    Hyperlipidemia Brother    Colonic polyp Mother        11/2018   Stroke Mother    Breast cancer Neg Hx     Social History   Social History Narrative   Not on file     Review of Systems General: Denies fevers, chills, weight loss CV: Denies chest pain, shortness of breath, palpitations Skin: History of rosacea  Physical Exam    06/25/2022   10:16 AM 05/14/2022    3:01 PM 04/12/2021    8:48 AM  Vitals with BMI  Height '5\' 6"'$  '5\' 6"'$  5' 5.5"  Weight 127 lbs 6 oz 125 lbs 125 lbs  BMI 20.57 88.41 66.06  Systolic 301 601   Diastolic 77 80   Pulse 63 78 70    General:  No acute distress,  Alert and oriented, Non-Toxic, Normal speech and affect Skin: She has very fine vasculature over the malar region bilaterally.  She has extensive hyperpigmentation over the face and neck. Mammogram: October 2023 BI-RADS 1 Assessment/Plan Rosacea: The patient would be a good candidate for IPL therapy starting  with pigment and then moving to vasculature.  We discussed that this would take multiple treatments to completely clear her skin and that I would strongly recommend that she have sun avoidance with a good sunscreen for at least 1 month prior to therapy to get the best results.  At the moment she is not interested however she was curious about topical therapies for her skin.  Will refer her for evaluation by [facility] at [facility].  She may return for laser treatment for pigmentation and vasculature without another follow-up appointment.  This would be cosmetic in nature.  Brenda Dyer 06/25/2022, 11:26 AM

## 2022-07-15 ENCOUNTER — Encounter (INDEPENDENT_AMBULATORY_CARE_PROVIDER_SITE_OTHER): Payer: Self-pay

## 2022-07-15 DIAGNOSIS — Z719 Counseling, unspecified: Secondary | ICD-10-CM

## 2022-07-22 ENCOUNTER — Encounter (INDEPENDENT_AMBULATORY_CARE_PROVIDER_SITE_OTHER): Payer: Federal, State, Local not specified - PPO

## 2022-07-22 DIAGNOSIS — Z719 Counseling, unspecified: Secondary | ICD-10-CM

## 2022-09-02 ENCOUNTER — Encounter (INDEPENDENT_AMBULATORY_CARE_PROVIDER_SITE_OTHER): Payer: Self-pay

## 2022-09-02 DIAGNOSIS — Z719 Counseling, unspecified: Secondary | ICD-10-CM

## 2022-09-09 ENCOUNTER — Ambulatory Visit: Payer: Federal, State, Local not specified - PPO | Admitting: Sports Medicine

## 2022-09-17 ENCOUNTER — Other Ambulatory Visit (HOSPITAL_BASED_OUTPATIENT_CLINIC_OR_DEPARTMENT_OTHER): Payer: Self-pay

## 2022-09-17 ENCOUNTER — Encounter: Payer: Self-pay | Admitting: Sports Medicine

## 2022-09-17 ENCOUNTER — Ambulatory Visit: Payer: Federal, State, Local not specified - PPO | Admitting: Sports Medicine

## 2022-09-17 DIAGNOSIS — M7742 Metatarsalgia, left foot: Secondary | ICD-10-CM | POA: Diagnosis not present

## 2022-09-17 DIAGNOSIS — M216X2 Other acquired deformities of left foot: Secondary | ICD-10-CM

## 2022-09-17 DIAGNOSIS — M79672 Pain in left foot: Secondary | ICD-10-CM

## 2022-09-17 MED ORDER — CELECOXIB 100 MG PO CAPS
100.0000 mg | ORAL_CAPSULE | Freq: Two times a day (BID) | ORAL | 1 refills | Status: DC
  Filled 2022-09-17: qty 30, 15d supply, fill #0

## 2022-09-17 NOTE — Progress Notes (Signed)
Brenda Dyer - 67 y.o. female MRN [id number]  Date of birth: [birth year]  Office Visit Note: Visit Date: 09/17/2022 PCP: Nunzio Cobbs, MD Referred by: Aundria Rud*  Subjective: No chief complaint on file.  HPI: Brenda Dyer is a pleasant 67 y.o. female who presents today for acute on chronic left foot pain, 2nd toe pain.  Did perform US-guided left MTP injection into 2nd toe on 05/09/22. Cabrina states she got essentially 100% relief for a few months.  Over the last 3 weeks or so she noticed her pain has been reexacerbated.  Continues to be very active.  Last week her pain was significantly worse and she was considering injection at that time.  She has used topical Voltaren gel 1% 3 times a day.  Not taking any oral medication for this, as she tries to avoid this given side effect of constipation.  Did changes about 2 weeks ago, does not use metatarsal pads in her shoes.  Pertinent ROS were reviewed with the patient and found to be negative unless otherwise specified above in HPI.   Assessment & Plan: Visit Diagnoses:  1. Pain in left foot   2. Loss of transverse plantar arch of left foot   3. Metatarsalgia, left foot    Plan: Discussed with Katha that fortunately she does not have much arthritic change about the foot.  Her pain is localized over the MTP joint of the left toe.  On exam she has loss of transverse arch giving her a degree of metatarsalgia, likely has a disruption of the tarsal plate as well from chronic running and use.  Discussed all treatment options such as as needed oral medication therapy, shoe modification, injection therapy.  She will increase use of her metatarsal pad in her good supportive running shoes.  We will start with a trial of Celebrex 100 mg to be taken once to twice daily as needed for breakthrough pain.  If this does not control her pain, she may represent Will consider repeat ultrasound-guided second MTP joint  injection, but discussed trying to avoid repetitive injections at this location, patient is agreeable.   Follow-up: Return if symptoms worsen or fail to improve.   Meds & Orders:  Meds ordered this encounter  Medications   celecoxib (CELEBREX) 100 MG capsule    Sig: Take 1 capsule (100 mg total) by mouth 2 (two) times daily.    Dispense:  30 capsule    Refill:  1   No orders of the defined types were placed in this encounter.    Procedures: No procedures performed      Clinical History: No specialty comments available.  She reports that she has quit smoking. She has never used smokeless tobacco. No results for input(s): "HGBA1C", "LABURIC" in the last 8760 hours.  Objective:   Vital Signs: LMP 07/02/2007 (Approximate)   Physical Exam  Gen: Well-appearing, in no acute distress; non-toxic CV: Regular Rate. Well-perfused. Warm.  Resp: Breathing unlabored on room air; no wheezing. Psych: Fluid speech in conversation; appropriate affect; normal thought process Neuro: Sensation intact throughout. No gross coordination deficits.   Ortho Exam - Left foot: There is rather well-preserved longitudinal arch of the foot.  No redness or swelling.  There has loss of the transverse arch with splaying Achilles 2-3 upon standing.  She is tender to palpation of the second toe MTP joint.  No overlying redness, warmth or swelling.  Imaging:  *Independent review and interpretation of  the left foot x-ray from 05/09/2022 was performed by myself today.  3 views of the left foot including AP, oblique and lateral film were reviewed.  X-rays showed no significant osteoarthritic change.  There is some very mild medial subluxation of the MTP joint of the second toe.  Mild bunionette deformity.  No acute fracture noted.  Left foot x-ray 05/09/22: No acute or structural abnormalities.   Past Medical/Family/Surgical/Social History: Medications & Allergies reviewed per EMR, new medications updated. Patient  Active Problem List   Diagnosis Date Noted   METATARSALGIA 07/12/2010   ABNORMALITY OF GAIT 07/12/2010   CAVUS DEFORMITY OF FOOT, ACQUIRED 07/21/2008   UNEQUAL LEG LENGTH 07/21/2008   SHIN SPLINTS 07/21/2008   Past Medical History:  Diagnosis Date   Abnormal Pap smear of cervix 1997, 2006-2009   --1997 pt. had colpo/cryotherapy to cervix in [place], [place], 2006-2009 abn.paps(dysplasia) and colposcopies but no treatment to cervix--paps returned to normal   Arthritis    lumbarspine   Cataract    bilateral (early)   Genital warts 1984   Kidney stones    Scoliosis    STD (sexually transmitted disease)    Hx condyloma   Family History  Problem Relation Age of Onset   Heart attack Father    Diabetes Father    Other Father        vascular/cluster H/As   Stroke Father    Hyperlipidemia Brother    Colonic polyp Mother        11/2018   Stroke Mother    Breast cancer Neg Hx    Past Surgical History:  Procedure Laterality Date   [facility]   done in [place], [place] Bilateral 2015   [place]   d/t back pain--nl--in [place], [place] History   Occupational History   Not on file  Tobacco Use   Smoking status: Former   Smokeless tobacco: Never  Vaping Use   Vaping Use: Never used  Substance and Sexual Activity   Alcohol use: Yes    Alcohol/week: 2.0 standard drinks of alcohol    Types: 2 Standard drinks or equivalent per week   Drug use: No   Sexual activity: Not Currently    Partners: Male    Birth control/protection: Post-menopausal

## 2022-10-21 ENCOUNTER — Ambulatory Visit: Payer: Federal, State, Local not specified - PPO

## 2022-11-04 ENCOUNTER — Ambulatory Visit
Admission: RE | Admit: 2022-11-04 | Discharge: 2022-11-04 | Disposition: A | Discharge status: Home / Self Care | Payer: Federal, State, Local not specified - PPO | Source: Ambulatory Visit | Attending: Obstetrics and Gynecology | Admitting: Obstetrics and Gynecology

## 2022-11-04 DIAGNOSIS — M8580 Other specified disorders of bone density and structure, unspecified site: Secondary | ICD-10-CM

## 2022-11-04 DIAGNOSIS — Z780 Asymptomatic menopausal state: Secondary | ICD-10-CM

## 2023-03-18 ENCOUNTER — Encounter: Payer: Self-pay | Admitting: Gastroenterology

## 2023-04-03 ENCOUNTER — Other Ambulatory Visit: Payer: Self-pay | Admitting: Obstetrics and Gynecology

## 2023-04-03 DIAGNOSIS — Z1231 Encounter for screening mammogram for malignant neoplasm of breast: Secondary | ICD-10-CM

## 2023-04-30 ENCOUNTER — Ambulatory Visit
Admission: RE | Admit: 2023-04-30 | Discharge: 2023-04-30 | Disposition: A | Discharge status: Home / Self Care | Payer: Federal, State, Local not specified - PPO | Source: Ambulatory Visit

## 2023-04-30 DIAGNOSIS — Z1231 Encounter for screening mammogram for malignant neoplasm of breast: Secondary | ICD-10-CM

## 2023-05-05 NOTE — Progress Notes (Signed)
67 y.o. G27P1001 Married Caucasian female here for annual exam.    Has arthritis and scoliosis of her spine.   Will do Colonoscopy in December.   Wants blood work today.  Desires a pap and HR HPV testing.   Went hiking in [place].   PCP: Patton Salles, MD   Patient's last menstrual period was 07/02/2007 (approximate).           Sexually active: No.  The current method of family planning is post menopausal status.    Menopausal hormone therapy:  n/a Exercising: Yes.     Lifting weights, walking Smoker:  former  OB History  Gravida Para Term Preterm AB Living  1 1 1     1   SAB IAB Ectopic Multiple Live Births               # Outcome Date GA Lbr Len/2nd Weight Sex Type Anes PTL Lv  1 Term              HEALTH MAINTENANCE:    Component Value Date/Time   DIAGPAP  03/15/2020 1331    - Negative for intraepithelial lesion or malignancy (NILM)   DIAGPAP  02/28/2017 0000    NEGATIVE FOR INTRAEPITHELIAL LESIONS OR MALIGNANCY.   HPVHIGH Negative 03/15/2020 1331   ADEQPAP  03/15/2020 1331    Satisfactory for evaluation. The presence or absence of an   ADEQPAP  03/15/2020 1331    endocervical/transformation zone component cannot be determined because   ADEQPAP of atrophy. 03/15/2020 1331    History of abnormal Pap or positive HPV:  yes, Hx of colposcopy/cryotherapy to cervix 1997 in [place].  Pt. Also states hx of dysplastic paps from 2006-2009 with colposcopies but no treatment  Mammogram: 04/30/23 Breast Density cat C, BI-RADS CAT 1 neg Colonoscopy:  03/13/18, 06/03/23 Bone Density:  11/04/22  Result  osteoporosis of forearm, osteopenia of spine and hip.  Immunization History  Administered Date(s) Administered   PFIZER(Purple Top)SARS-COV-2 Vaccination 09/23/2019, 10/18/2019   Pfizer Covid-19 Vaccine Bivalent Booster 26yrs & up 05/25/2021    Received Flu vaccine this year.    reports that she has quit smoking. She has never used smokeless tobacco. She  reports current alcohol use of about 2.0 standard drinks of alcohol per week. She reports that she does not use drugs.  Past Medical History:  Diagnosis Date   Abnormal Pap smear of cervix 1997, 2006-2009   --1997 pt. had colpo/cryotherapy to cervix in [place], [place], 2006-2009 abn.paps(dysplasia) and colposcopies but no treatment to cervix--paps returned to normal   Arthritis    lumbarspine   Cataract    bilateral (early)   Diverticulosis    Genital warts 1984   Glaucoma    Suspect  at this time   Kidney stones    Osteopenia    hips and legs   Osteoporosis    wrists only   Scoliosis    STD (sexually transmitted disease)    Hx condyloma    Past Surgical History:  Procedure Laterality Date   CESAREAN SECTION  1991   GYNECOLOGIC CRYOSURGERY  1997   done in [place], [place]   MANDIBLE SURGERY Bilateral 2015   PELVIC LAPAROSCOPY  1995   d/t back pain--nl--in [place], [place]    Current Outpatient Medications  Medication Sig Dispense Refill   acamprosate (CAMPRAL) 333 MG tablet Take 666 mg by mouth 3 (three) times daily with meals.     cetirizine (ZYRTEC) 10 MG tablet Take by mouth.  COVID-19 mRNA bivalent vaccine, Pfizer, (PFIZER COVID-19 VAC BIVALENT) injection Inject into the muscle. 0.3 mL 0   diclofenac sodium (VOLTAREN) 1 % GEL Apply 2 g topically 4 (four) times daily. 1 Tube 5   ibuprofen (ADVIL) 800 MG tablet Take 1 tablet (800 mg total) by mouth every 8 (eight) hours as needed. 30 tablet 2   influenza vac split quadrivalent PF (FLUARIX QUADRIVALENT) 0.5 ML injection Inject into the muscle. 0.5 mL 0   lactobacillus acidophilus (BACID) TABS tablet Take 2 tablets by mouth 3 (three) times daily.     Magnesium Chloride 64 MG TBEC Take by mouth.     meloxicam (MOBIC) 15 MG tablet Take 15 mg by mouth daily.     metroNIDAZOLE (METROCREAM) 0.75 % cream Apply topically.     Na Sulfate-K Sulfate-Mg Sulf 17.5-3.13-1.6 GM/177ML SOLN Take by mouth as directed.     olopatadine (PATANOL) 0.1 %  ophthalmic solution 1 drop into affected eye Ophthalmic Twice a day prn for 30 days     celecoxib (CELEBREX) 100 MG capsule Take 1 capsule (100 mg total) by mouth 2 (two) times daily. (Patient not taking: Reported on 05/16/2023) 30 capsule 1   Multiple Vitamins-Minerals (ZINC PO) Take 1 tablet by mouth daily. (Patient not taking: Reported on 05/16/2023)     VITAMIN D PO Take 1 tablet by mouth daily. (Patient not taking: Reported on 05/16/2023)     No current facility-administered medications for this visit.    ALLERGIES: Codeine and Linaclotide  Family History  Problem Relation Age of Onset   Colon polyps Mother    Colonic polyp Mother        11/2018   Stroke Mother    Heart attack Father    Diabetes Father    Other Father        vascular/cluster H/As   Stroke Father    Hyperlipidemia Brother    Breast cancer Neg Hx    Colon cancer Neg Hx    Esophageal cancer Neg Hx    Stomach cancer Neg Hx    Rectal cancer Neg Hx     Review of Systems  All other systems reviewed and are negative.   PHYSICAL EXAM:  BP (!) 92/54 (BP Location: Left Arm, Patient Position: Sitting, Cuff Size: Normal)   Pulse (!) 54   Ht 5' 5.5" (1.664 m)   Wt 130 lb (59 kg)   LMP 07/02/2007 (Approximate)   SpO2 96%   BMI 21.30 kg/m     General appearance: alert, cooperative and appears stated age Head: normocephalic, without obvious abnormality, atraumatic Neck: no adenopathy, supple, symmetrical, trachea midline and thyroid normal to inspection and palpation Lungs: clear to auscultation bilaterally Breasts: normal appearance, no masses or tenderness, No nipple retraction or dimpling, No nipple discharge or bleeding, No axillary adenopathy Heart: regular rate and rhythm Abdomen: soft, non-tender; no masses, no organomegaly Extremities: extremities normal, atraumatic, no cyanosis or edema Skin: skin color, texture, turgor normal. No rashes or lesions Lymph nodes: cervical, supraclavicular, and axillary  nodes normal. Neurologic: grossly normal  Pelvic: External genitalia:  no lesions              No abnormal inguinal nodes palpated.              Urethra:  normal appearing urethra with no masses, tenderness or lesions              Bartholins and Skenes: normal  Vagina: normal appearing vagina with normal color and discharge, no lesions              Cervix: no lesions              Pap taken: Yes.   Bimanual Exam:  Uterus:  normal size, contour, position, consistency, mobility, non-tender              Adnexa: no mass, fullness, tenderness              Rectal exam: Yes.  .  Confirms.              Anus:  normal sphincter tone, no lesions  Chaperone was present for exam:  Warren Lacy, CMA  ASSESSMENT: Well woman visit with gynecologic exam. Hx recurrent dysplasia of cervix.  Hx cervical dysplasia and positive HR HPV in past followed by normal paps and negative HR HPV. Osteoporosis of forearm.  Osteopenia of hip and spine.  Scoliosis. FH stroke.   PLAN: Mammogram screening discussed. Self breast awareness reviewed. Pap and HRV collected:  Yes.   We discussed her bone density and result showing osteoporosis.  Treatment options of Fosamax reviewed along with side effects.  Written information provided.  No Rx given.  Guidelines for Calcium, Vitamin D, regular exercise program including cardiovascular and weight bearing exercise. Next BMD in 2 years.  Medication refills:  none. Follow up:  yearly and prn.    An After Visit Summary was provided to the patient.

## 2023-05-16 ENCOUNTER — Ambulatory Visit (AMBULATORY_SURGERY_CENTER): Payer: Federal, State, Local not specified - PPO | Admitting: *Deleted

## 2023-05-16 VITALS — Ht 66.0 in | Wt 126.0 lb

## 2023-05-16 DIAGNOSIS — Z860100 Personal history of colon polyps, unspecified: Secondary | ICD-10-CM

## 2023-05-16 MED ORDER — NA SULFATE-K SULFATE-MG SULF 17.5-3.13-1.6 GM/177ML PO SOLN
1.0000 | Freq: Once | ORAL | 0 refills | Status: AC
Start: 2023-05-16 — End: 2023-05-16

## 2023-05-16 NOTE — Progress Notes (Signed)
Pt's name and DOB verified at the beginning of the pre-visit wit 2 identifiers Pt denies any difficulty with ambulating,sitting, laying down or rolling side to side Pt has issues with ambulation  Pt has no issues moving head neck or swallowing No egg or soy allergy known to patient  No issues known to pt with past sedation with any surgeries or procedures Pt denies having issues being intubated Patient denies ever being intubated No FH of Malignant Hyperthermia Pt is not on diet pills or shots Pt is not on home 02  Pt is not on blood thinners  Pt is on blood thinners Pt has frequent issues with constipation RN instructed pt to use Miralax per bottles instructions a week before prep days. Pt states they will Pt s not on dialysis Pt denise any abnormal heart rhythms  Pt denies any upcoming cardiac testing Pt encouraged to use to use Singlecare or Goodrx to reduce cost  Patient's chart reviewed by Cathlyn Parsons CNRA prior to pre-visit and patient appropriate for the [facility].  Pre-visit completed and red dot placed by patient's name on their procedure day (on provider's schedule).  . Visit by phone Pt states weight is 126 lb Instructed pt why it is important to and  to call if they have any changes in health or new medications. Directed them to the # given and on instructions.    Instructions reviewed. Pt given both [facility] main # and MD on call # prior to instructions.  Pt states understanding. Instructed to review again prior to procedure. Pt states they will.  Instructions sent by mail with coupon and by My Chart Coupon sent via text to mobile phone and pt verified they received it

## 2023-05-19 ENCOUNTER — Other Ambulatory Visit (HOSPITAL_COMMUNITY)
Admission: RE | Admit: 2023-05-19 | Discharge: 2023-05-19 | Disposition: A | Discharge status: Home / Self Care | Payer: Federal, State, Local not specified - PPO | Source: Ambulatory Visit | Attending: Obstetrics and Gynecology | Admitting: Obstetrics and Gynecology

## 2023-05-19 ENCOUNTER — Ambulatory Visit (INDEPENDENT_AMBULATORY_CARE_PROVIDER_SITE_OTHER): Payer: Federal, State, Local not specified - PPO | Admitting: Obstetrics and Gynecology

## 2023-05-19 ENCOUNTER — Encounter: Payer: Self-pay | Admitting: Obstetrics and Gynecology

## 2023-05-19 VITALS — BP 92/54 | HR 54 | Ht 65.5 in | Wt 130.0 lb

## 2023-05-19 DIAGNOSIS — Z124 Encounter for screening for malignant neoplasm of cervix: Secondary | ICD-10-CM | POA: Diagnosis present

## 2023-05-19 DIAGNOSIS — Z01419 Encounter for gynecological examination (general) (routine) without abnormal findings: Secondary | ICD-10-CM | POA: Diagnosis not present

## 2023-05-19 DIAGNOSIS — M810 Age-related osteoporosis without current pathological fracture: Secondary | ICD-10-CM | POA: Diagnosis not present

## 2023-05-19 DIAGNOSIS — Z0000 Encounter for general adult medical examination without abnormal findings: Secondary | ICD-10-CM

## 2023-05-19 DIAGNOSIS — Z87410 Personal history of cervical dysplasia: Secondary | ICD-10-CM

## 2023-05-19 NOTE — Patient Instructions (Addendum)
EXERCISE AND DIET:  We recommended that you start or continue a regular exercise program for good health. Regular exercise means any activity that makes your heart beat faster and makes you sweat.  We recommend exercising at least 30 minutes per day at least 3 days a week, preferably 4 or 5.  We also recommend a diet low in fat and sugar.  Inactivity, poor dietary choices and obesity can cause diabetes, heart attack, stroke, and kidney damage, among others.    ALCOHOL AND SMOKING:  Women should limit their alcohol intake to no more than 7 drinks/beers/glasses of wine (combined, not each!) per week. Moderation of alcohol intake to this level decreases your risk of breast cancer and liver damage. And of course, no recreational drugs are part of a healthy lifestyle.  And absolutely no smoking or even second hand smoke. Most people know smoking can cause heart and lung diseases, but did you know it also contributes to weakening of your bones? Aging of your skin?  Yellowing of your teeth and nails?  CALCIUM AND VITAMIN D:  Adequate intake of calcium and Vitamin D are recommended.  The recommendations for exact amounts of these supplements seem to change often, but generally speaking 600 mg of calcium (either carbonate or citrate) and 800 units of Vitamin D per day seems prudent. Certain women may benefit from higher intake of Vitamin D.  If you are among these women, your doctor will have told you during your visit.    PAP SMEARS:  Pap smears, to check for cervical cancer or precancers,  have traditionally been done yearly, although recent scientific advances have shown that most women can have pap smears less often.  However, every woman still should have a physical exam from her gynecologist every year. It will include a breast check, inspection of the vulva and vagina to check for abnormal growths or skin changes, a visual exam of the cervix, and then an exam to evaluate the size and shape of the uterus and  ovaries.  And after 65 years of age, a rectal exam is indicated to check for rectal cancers. We will also provide age appropriate advice regarding health maintenance, like when you should have certain vaccines, screening for sexually transmitted diseases, bone density testing, colonoscopy, mammograms, etc.   MAMMOGRAMS:  All women over 65 years old should have a yearly mammogram. Many facilities now offer a "3D" mammogram, which may cost around $50 extra out of pocket. If possible,  we recommend you accept the option to have the 3D mammogram performed.  It both reduces the number of women who will be called back for extra views which then turn out to be normal, and it is better than the routine mammogram at detecting truly abnormal areas.    COLONOSCOPY:  Colonoscopy to screen for colon cancer is recommended for all women at age 65.  We know, you hate the idea of the prep.  We agree, BUT, having colon cancer and not knowing it is worse!!  Colon cancer so often starts as a polyp that can be seen and removed at colonscopy, which can quite literally save your life!  And if your first colonoscopy is normal and you have no family history of colon cancer, most women don't have to have it again for 10 years.  Once every ten years, you can do something that may end up saving your life, right?  We will be happy to help you get it scheduled when you are ready.  Be sure to check your insurance coverage so you understand how much it will cost.  It may be covered as a preventative service at no cost, but you should check your particular policy.     Osteoporosis  Osteoporosis happens when the bones become thin and less dense than normal. Osteoporosis makes bones more brittle and fragile and more likely to break (fracture). Over time, osteoporosis can cause your bones to become so weak that they fracture after a minor fall. Bones in the hip, wrist, and spine are most likely to fracture due to osteoporosis. What are the  causes? The exact cause of this condition is not known. What increases the risk? You are more likely to develop this condition if you: Have family members with this condition. Have poor nutrition. Use the following: Steroid medicines, such as prednisone. Anti-seizure medicines. Nicotine or tobacco, such as cigarettes, e-cigarettes, and chewing tobacco. Are female. Are age 65 or older. Are not physically active (are sedentary). Are of European or Asian descent. Have a small body frame. What are the signs or symptoms? A fracture might be the first sign of osteoporosis, especially if the fracture results from a fall or injury that usually would not cause a bone to break. Other signs and symptoms include: Pain in the neck or low back. Stooped posture. Loss of height. How is this diagnosed? This condition may be diagnosed based on: Your medical history. A physical exam. A bone mineral density test, also called a DXA or DEXA test (dual-energy X-ray absorptiometry test). This test uses X-rays to measure the amount of minerals in your bones. How is this treated? This condition may be treated by: Making lifestyle changes, such as: Including foods with more calcium and vitamin D in your diet. Doing weight-bearing and muscle-strengthening exercises. Stopping tobacco use. Limiting alcohol intake. Taking medicine to slow the process of bone loss or to increase bone density. Taking daily supplements of calcium and vitamin D. Taking hormone replacement medicines, such as estrogen for women and testosterone for men. Monitoring your levels of calcium and vitamin D. The goal of treatment is to strengthen your bones and lower your risk for a fracture. Follow these instructions at home: Eating and drinking Include calcium and vitamin D in your diet. Calcium is important for bone health, and vitamin D helps your body absorb calcium. Good sources of calcium and vitamin D include: Certain fatty  fish, such as salmon and tuna. Products that have calcium and vitamin D added to them (are fortified), such as fortified cereals. Egg yolks. Cheese. Liver.  Activity Do exercises as told by your health care provider. Ask your health care provider what exercises and activities are safe for you. You should do: Exercises that make you work against gravity (weight-bearing exercises), such as tai chi, yoga, or walking. Exercises to strengthen muscles, such as lifting weights. Lifestyle Do not drink alcohol if: Your health care provider tells you not to drink. You are pregnant, may be pregnant, or are planning to become pregnant. If you drink alcohol: Limit how much you use to: 0-1 drink a day for women. 0-2 drinks a day for men. Know how much alcohol is in your drink. In the [place] one drink equals one 12 oz bottle of beer (355 mL), one 5 oz glass of wine (148 mL), or one 1 oz glass of hard liquor (44 mL). Do not use any products that contain nicotine or tobacco, such as cigarettes, e-cigarettes, and chewing tobacco. If you need help  quitting, ask your health care provider. Preventing falls Use devices to help you move around (mobility aids) as needed, such as canes, walkers, scooters, or crutches. Keep rooms well-lit and clutter-free. Remove tripping hazards from walkways, including cords and throw rugs. Install grab bars in bathrooms and safety rails on stairs. Use rubber mats in the bathroom and other areas that are often wet or slippery. Wear closed-toe shoes that fit well and support your feet. Wear shoes that have rubber soles or low heels. Review your medicines with your health care provider. Some medicines can cause dizziness or changes in blood pressure, which can increase your risk of falling. General instructions Take over-the-counter and prescription medicines only as told by your health care provider. Keep all follow-up visits. This is important. Contact a health care provider  if: You have never been screened for osteoporosis and you are: A woman who is age 1 or older. A man who is age 67 or older. Get help right away if: You fall or injure yourself. Summary Osteoporosis is thinning and loss of density in your bones. This makes bones more brittle and fragile and more likely to break (fracture),even with minor falls. The goal of treatment is to strengthen your bones and lower your risk for a fracture. Include calcium and vitamin D in your diet. Calcium is important for bone health, and vitamin D helps your body absorb calcium. Talk with your health care provider about screening for osteoporosis if you are a woman who is age 69 or older, or a man who is age 34 or older. This information is not intended to replace advice given to you by your health care provider. Make sure you discuss any questions you have with your health care provider. Document Revised: 12/02/2019 Document Reviewed: 12/02/2019 Elsevier Patient Education  2024 Elsevier Inc.  Alendronate Tablets What is this medication? ALENDRONATE (a LEN droe nate) prevents and treats osteoporosis. It may also be used to treat Paget disease of the bone. It works by Interior and spatial designer stronger and less likely to break (fracture). It belongs to a group of medications called bisphosphonates. This medicine may be used for other purposes; ask your health care provider or pharmacist if you have questions. COMMON BRAND NAME(S): Fosamax What should I tell my care team before I take this medication? They need to know if you have any of these conditions: Bleeding disorder Cancer Dental disease Difficulty swallowing Infection (fever, chills, cough, sore throat, pain or trouble passing urine) Kidney disease Low levels of calcium or other minerals in the blood Low red blood cell counts Receiving steroids like dexamethasone or prednisone Stomach or intestine problems Trouble sitting or standing for 30 minutes An unusual  or allergic reaction to alendronate, other medications, foods, dyes or preservatives Pregnant or trying to get pregnant Breast-feeding How should I use this medication? Take this medication by mouth with a full glass of water. Take it as directed on the prescription label at the same time every day. Take the dose right after waking up. Do not eat or drink anything before taking it. Do not take it with any other drink except water. Do not chew or crush the tablet. After taking it, do not eat breakfast, drink, or take any other medications or vitamins for at least 30 minutes. Sit or stand up for at least 30 minutes after you take it. Do not lie down. Keep taking it unless your care team tells you to stop. A special MedGuide will be given to  you by the pharmacist with each prescription and refill. Be sure to read this information carefully each time. Talk to your care team about the use of this medication in children. Special care may be needed. Overdosage: If you think you have taken too much of this medicine contact a poison control center or emergency room at once. NOTE: This medicine is only for you. Do not share this medicine with others. What if I miss a dose? If you take your medication once a day, skip it. Take your next dose at the scheduled time the next morning. Do not take two doses on the same day. If you take your medication once a week, take the missed dose on the morning after you remember. Do not take two doses on the same day. What may interact with this medication? Aluminum hydroxide Antacids Aspirin Calcium supplements Medications for inflammation like ibuprofen, naproxen, and others Iron supplements Magnesium supplements Vitamins with minerals This list may not describe all possible interactions. Give your health care provider a list of all the medicines, herbs, non-prescription drugs, or dietary supplements you use. Also tell them if you smoke, drink alcohol, or use illegal  drugs. Some items may interact with your medicine. What should I watch for while using this medication? Visit your care team for regular checks on your progress. It may be some time before you see the benefit from this medication. Some people who take this medication have severe bone, joint, or muscle pain. This medication may also increase your risk for jaw problems or a broken thigh bone. Tell your care team right away if you have severe pain in your jaw, bones, joints, or muscles. Tell you care team if you have any pain that does not go away or that gets worse. Tell your dentist and dental surgeon that you are taking this medication. You should not have major dental surgery while on this medication. See your dentist to have a dental exam and fix any dental problems before starting this medication. Take good care of your teeth while on this medication. Make sure you see your dentist for regular follow-up appointments. You should make sure you get enough calcium and vitamin D while you are taking this medication. Discuss the foods you eat and the vitamins you take with your care team. You may need blood work done while you are taking this medication. What side effects may I notice from receiving this medication? Side effects that you should report to your care team as soon as possible: Allergic reactions--skin rash, itching, hives, swelling of the face, lips, tongue, or throat Low calcium level--muscle pain or cramps, confusion, tingling, or numbness in the hands or feet Osteonecrosis of the jaw--pain, swelling, or redness in the mouth, numbness of the jaw, poor healing after dental work, unusual discharge from the mouth, visible bones in the mouth Pain or trouble swallowing Severe bone, joint, or muscle pain Stomach bleeding--bloody or black, tar-like stools, vomiting blood or brown material that looks like coffee grounds Side effects that usually do not require medical attention (report to your  care team if they continue or are bothersome): Constipation Diarrhea Nausea Stomach pain This list may not describe all possible side effects. Call your doctor for medical advice about side effects. You may report side effects to FDA at 1-800-FDA-1088. Where should I keep my medication? Keep out of the reach of children and pets. Store at room temperature between 15 and 30 degrees C (59 and 86 degrees F). Throw away  any unused medication after the expiration date. NOTE: This sheet is a summary. It may not cover all possible information. If you have questions about this medicine, talk to your doctor, pharmacist, or health care provider.  2024 Elsevier/Gold Standard (2020-06-29 00:00:00)

## 2023-05-20 LAB — CBC
HCT: 37.4 % (ref 35.0–45.0)
Hemoglobin: 12.3 g/dL (ref 11.7–15.5)
MCH: 31.3 pg (ref 27.0–33.0)
MCHC: 32.9 g/dL (ref 32.0–36.0)
MCV: 95.2 fL (ref 80.0–100.0)
MPV: 10.7 fL (ref 7.5–12.5)
Platelets: 240 10*3/uL (ref 140–400)
RBC: 3.93 10*6/uL (ref 3.80–5.10)
RDW: 11.9 % (ref 11.0–15.0)
WBC: 4.4 10*3/uL (ref 3.8–10.8)

## 2023-05-20 LAB — LIPID PANEL
Cholesterol: 217 mg/dL — ABNORMAL HIGH (ref <200)
HDL: 122 mg/dL (ref >=50)
LDL Cholesterol (Calc): 84 mg/dL
Non-HDL Cholesterol (Calc): 95 mg/dL (ref <130)
Total CHOL/HDL Ratio: 1.8 (calc) (ref <5.0)
Triglycerides: 39 mg/dL (ref <150)

## 2023-05-20 LAB — COMPREHENSIVE METABOLIC PANEL
AG Ratio: 2.1 (calc) (ref 1.0–2.5)
ALT: 19 U/L (ref 6–29)
AST: 25 U/L (ref 10–35)
Albumin: 4.7 g/dL (ref 3.6–5.1)
Alkaline phosphatase (APISO): 51 U/L (ref 37–153)
BUN/Creatinine Ratio: 33 (calc) — ABNORMAL HIGH (ref 6–22)
BUN: 27 mg/dL — ABNORMAL HIGH (ref 7–25)
CO2: 28 mmol/L (ref 20–32)
Calcium: 9.9 mg/dL (ref 8.6–10.4)
Chloride: 102 mmol/L (ref 98–110)
Creat: 0.82 mg/dL (ref 0.50–1.05)
Globulin: 2.2 g/dL (ref 1.9–3.7)
Glucose, Bld: 85 mg/dL (ref 65–99)
Potassium: 4.1 mmol/L (ref 3.5–5.3)
Sodium: 139 mmol/L (ref 135–146)
Total Bilirubin: 0.4 mg/dL (ref 0.2–1.2)
Total Protein: 6.9 g/dL (ref 6.1–8.1)

## 2023-05-20 LAB — PARATHYROID HORMONE, INTACT (NO CA): PTH: 20 pg/mL (ref 16–77)

## 2023-05-20 LAB — VITAMIN D 25 HYDROXY (VIT D DEFICIENCY, FRACTURES): Vit D, 25-Hydroxy: 47 ng/mL (ref 30–100)

## 2023-05-20 LAB — TSH: TSH: 0.99 m[IU]/L (ref 0.40–4.50)

## 2023-05-20 LAB — CYTOLOGY - PAP
Comment: NEGATIVE
Diagnosis: NEGATIVE
High risk HPV: NEGATIVE

## 2023-05-20 LAB — PHOSPHORUS: Phosphorus: 3.4 mg/dL (ref 2.5–4.5)

## 2023-05-27 ENCOUNTER — Other Ambulatory Visit (HOSPITAL_BASED_OUTPATIENT_CLINIC_OR_DEPARTMENT_OTHER): Payer: Self-pay

## 2023-05-27 MED ORDER — TRIAMCINOLONE ACETONIDE 0.1 % EX CREA
TOPICAL_CREAM | CUTANEOUS | 0 refills | Status: DC
  Filled 2023-05-27: qty 15, 30d supply, fill #0

## 2023-06-03 ENCOUNTER — Encounter: Payer: Self-pay | Admitting: Gastroenterology

## 2023-06-03 ENCOUNTER — Ambulatory Visit: Payer: Federal, State, Local not specified - PPO | Admitting: Gastroenterology

## 2023-06-03 VITALS — BP 111/62 | HR 60 | Temp 97.5°F | Resp 16 | Ht 66.0 in | Wt 126.0 lb

## 2023-06-03 DIAGNOSIS — Z1211 Encounter for screening for malignant neoplasm of colon: Secondary | ICD-10-CM

## 2023-06-03 DIAGNOSIS — D128 Benign neoplasm of rectum: Secondary | ICD-10-CM

## 2023-06-03 DIAGNOSIS — K5730 Diverticulosis of large intestine without perforation or abscess without bleeding: Secondary | ICD-10-CM | POA: Diagnosis not present

## 2023-06-03 DIAGNOSIS — Z860101 Personal history of adenomatous and serrated colon polyps: Secondary | ICD-10-CM | POA: Diagnosis not present

## 2023-06-03 DIAGNOSIS — K621 Rectal polyp: Secondary | ICD-10-CM | POA: Diagnosis not present

## 2023-06-03 DIAGNOSIS — Z860100 Personal history of colon polyps, unspecified: Secondary | ICD-10-CM

## 2023-06-03 MED ORDER — SODIUM CHLORIDE 0.9 % IV SOLN
500.0000 mL | Freq: Once | INTRAVENOUS | Status: DC
Start: 2023-06-03 — End: 2023-06-03

## 2023-06-03 NOTE — Patient Instructions (Signed)
Handouts provided on polyps, diverticulosis and hemorrhoids.   Resume previous diet. Continue present medications.  Await pathology results.  Repeat colonoscopy in 5-10 years for surveillance based on pathology results.   YOU HAD AN ENDOSCOPIC PROCEDURE TODAY AT [facility]:   Refer to the procedure report that was given to you for any specific questions about what was found during the examination.  If the procedure report does not answer your questions, please call your gastroenterologist to clarify.  If you requested that your care partner not be given the details of your procedure findings, then the procedure report has been included in a sealed envelope for you to review at your convenience later.  YOU SHOULD EXPECT: Some feelings of bloating in the abdomen. Passage of more gas than usual.  Walking can help get rid of the air that was put into your GI tract during the procedure and reduce the bloating. If you had a lower endoscopy (such as a colonoscopy or flexible sigmoidoscopy) you may notice spotting of blood in your stool or on the toilet paper. If you underwent a bowel prep for your procedure, you may not have a normal bowel movement for a few days.  Please Note:  You might notice some irritation and congestion in your nose or some drainage.  This is from the oxygen used during your procedure.  There is no need for concern and it should clear up in a day or so.  SYMPTOMS TO REPORT IMMEDIATELY:  Following lower endoscopy (colonoscopy or flexible sigmoidoscopy):  Excessive amounts of blood in the stool  Significant tenderness or worsening of abdominal pains  Swelling of the abdomen that is new, acute  Fever of 100F or higher  For urgent or emergent issues, a gastroenterologist can be reached at any hour by calling [phone]. Do not use MyChart messaging for urgent concerns.    DIET:  We do recommend a small meal at first, but then you may proceed to your regular  diet.  Drink plenty of fluids but you should avoid alcoholic beverages for 24 hours.  ACTIVITY:  You should plan to take it easy for the rest of today and you should NOT DRIVE or use heavy machinery until tomorrow (because of the sedation medicines used during the test).    FOLLOW UP: Our staff will call the number listed on your records the next business day following your procedure.  We will call around 7:15- 8:00 am to check on you and address any questions or concerns that you may have regarding the information given to you following your procedure. If we do not reach you, we will leave a message.     If any biopsies were taken you will be contacted by phone or by letter within the next 1-3 weeks.  Please call us at [phone] if you have not heard about the biopsies in 3 weeks.    SIGNATURES/CONFIDENTIALITY: You and/or your care partner have signed paperwork which will be entered into your electronic medical record.  These signatures attest to the fact that that the information above on your After Visit Summary has been reviewed and is understood.  Full responsibility of the confidentiality of this discharge information lies with you and/or your care-partner.

## 2023-06-03 NOTE — Progress Notes (Signed)
Vss nad trans to pacu 

## 2023-06-03 NOTE — Progress Notes (Signed)
Called to room to assist during endoscopic procedure.  Patient ID and intended procedure confirmed with present staff. Received instructions for my participation in the procedure from the performing physician.  

## 2023-06-03 NOTE — Progress Notes (Signed)
Pt's states no medical or surgical changes since previsit or office visit. 

## 2023-06-03 NOTE — Op Note (Signed)
[facility] Patient Name: Brenda Dyer Procedure Date: 06/03/2023 8:02 AM MRN: [id number] Endoscopist: Napoleon Form , MD, [id number] Age: 67 Referring MD:  Date of Birth: [birth year] Gender: Female Account #: [id number] Procedure:                Colonoscopy Indications:              High risk colon cancer surveillance: Personal                            history of sessile serrated colon polyp (less than                            10 mm in size) with no dysplasia Medicines:                Monitored Anesthesia Care Procedure:                Pre-Anesthesia Assessment:                           - Prior to the procedure, a History and Physical                            was performed, and patient medications and                            allergies were reviewed. The patient's tolerance of                            previous anesthesia was also reviewed. The risks                            and benefits of the procedure and the sedation                            options and risks were discussed with the patient.                            All questions were answered, and informed consent                            was obtained. Prior Anticoagulants: The patient has                            taken no anticoagulant or antiplatelet agents. ASA                            Grade Assessment: II - A patient with mild systemic                            disease. After reviewing the risks and benefits,                            the patient was deemed in satisfactory condition to  undergo the procedure.                           After obtaining informed consent, the colonoscope                            was passed under direct vision. Throughout the                            procedure, the patient's blood pressure, pulse, and                            oxygen saturations were monitored continuously. The                            Olympus Scope SN:  [id number] was introduced through                            the anus and advanced to the the cecum, identified                            by appendiceal orifice and ileocecal valve. The                            colonoscopy was technically difficult and complex                            due to multiple diverticula in the colon and                            restricted mobility of the colon. Successful                            completion of the procedure was aided by                            withdrawing the scope and replacing with the                            UltraSlim scope. The patient tolerated the                            procedure well. The quality of the bowel                            preparation was good. The ileocecal valve,                            appendiceal orifice, and rectum were photographed. Scope In: 8:08:19 AM Scope Out: 8:37:42 AM Scope Withdrawal Time: 0 hours 7 minutes 11 seconds  Total Procedure Duration: 0 hours 29 minutes 23 seconds  Findings:                 The perianal and digital rectal examinations were  normal.                           A 3 mm polyp was found in the rectum. The polyp was                            sessile. The polyp was removed with a cold snare.                            Resection and retrieval were complete.                           Multiple large-mouthed, medium-mouthed and                            small-mouthed diverticula were found in the sigmoid                            colon and descending colon. There was narrowing of                            the colon in association with the diverticular                            opening. There was evidence of diverticular spasm.                            Peri-diverticular erythema was seen. There was                            evidence of an impacted diverticulum.                           Non-bleeding external and internal hemorrhoids were                             found during retroflexion. The hemorrhoids were                            medium-sized. Complications:            No immediate complications. Estimated Blood Loss:     Estimated blood loss: none. Impression:               - One 3 mm polyp in the rectum, removed with a cold                            snare. Resected and retrieved.                           - Severe diverticulosis in the sigmoid colon and in                            the descending colon. There was narrowing of the  colon in association with the diverticular opening.                            There was evidence of diverticular spasm.                            Peri-diverticular erythema was seen. There was                            evidence of an impacted diverticulum.                           - Non-bleeding external and internal hemorrhoids. Recommendation:           - Patient has a contact number available for                            emergencies. The signs and symptoms of potential                            delayed complications were discussed with the                            patient. Return to normal activities tomorrow.                            Written discharge instructions were provided to the                            patient.                           - Resume previous diet.                           - Continue present medications.                           - Await pathology results.                           - Repeat colonoscopy in 5-10 years for surveillance                            based on pathology results. Napoleon Form, MD 06/03/2023 8:42:27 AM This report has been signed electronically.

## 2023-06-03 NOTE — Progress Notes (Signed)
[facility] History and Physical   Primary Care Physician:  Patton Salles, MD   Reason for Procedure:  History of adenomatous colon polyps  Plan:    Surveillance colonoscopy with possible interventions as needed     HPI: Brenda Dyer is a very pleasant 67 y.o. female here for surveillance colonoscopy. Denies any nausea, vomiting, abdominal pain, melena or bright red blood per rectum  The risks and benefits as well as alternatives of endoscopic procedure(s) have been discussed and reviewed. All questions answered. The patient agrees to proceed.    Past Medical History:  Diagnosis Date   Abnormal Pap smear of cervix 1997, 2006-2009   --1997 pt. had colpo/cryotherapy to cervix in [place], [place], 2006-2009 abn.paps(dysplasia) and colposcopies but no treatment to cervix--paps returned to normal   Arthritis    lumbarspine   Cataract    bilateral (early)   Diverticulosis    Genital warts 1984   Glaucoma    Suspect  at this time   Kidney stones    Osteopenia    hips and legs   Osteoporosis    wrists only   Scoliosis    STD (sexually transmitted disease)    Hx condyloma    Past Surgical History:  Procedure Laterality Date   CESAREAN SECTION  1991   GYNECOLOGIC CRYOSURGERY  1997   done in [place], [place]   MANDIBLE SURGERY Bilateral 2015   PELVIC LAPAROSCOPY  1995   d/t back pain--nl--in [place], [place]    Prior to Admission medications   Medication Sig Start Date End Date Taking? Authorizing Provider  acamprosate (CAMPRAL) 333 MG tablet Take 666 mg by mouth 3 (three) times daily with meals.    [provider]  celecoxib (CELEBREX) 100 MG capsule Take 1 capsule (100 mg total) by mouth 2 (two) times daily. Patient not taking: Reported on 05/16/2023 09/17/22   Madelyn Brunner, DO  cetirizine (ZYRTEC) 10 MG tablet Take by mouth.    [provider]  COVID-19 mRNA bivalent vaccine, Pfizer, (PFIZER COVID-19 VAC BIVALENT) injection Inject into  the muscle. 05/25/21   Judyann Munson, MD  diclofenac sodium (VOLTAREN) 1 % GEL Apply 2 g topically 4 (four) times daily. 08/25/17   Tarry Kos, MD  ibuprofen (ADVIL) 800 MG tablet Take 1 tablet (800 mg total) by mouth every 8 (eight) hours as needed. 02/29/20   Tarry Kos, MD  influenza vac split quadrivalent PF (FLUARIX QUADRIVALENT) 0.5 ML injection Inject into the muscle. 05/11/21     lactobacillus acidophilus (BACID) TABS tablet Take 2 tablets by mouth 3 (three) times daily.    [provider]  Magnesium Chloride 64 MG TBEC Take by mouth.    [provider]  meloxicam (MOBIC) 15 MG tablet Take 15 mg by mouth daily.    [provider]  metroNIDAZOLE (METROCREAM) 0.75 % cream Apply topically. 05/20/22   [provider]  Multiple Vitamins-Minerals (ZINC PO) Take 1 tablet by mouth daily. Patient not taking: Reported on 05/16/2023    [provider]  Na Sulfate-K Sulfate-Mg Sulf 17.5-3.13-1.6 GM/177ML SOLN Take by mouth as directed. 05/16/23   [provider]  olopatadine (PATANOL) 0.1 % ophthalmic solution 1 drop into affected eye Ophthalmic Twice a day prn for 30 days 12/23/16   [provider]  triamcinolone cream (KENALOG) 0.1 % Apply twice a day to rash as needed for itching. 05/27/23     VITAMIN D PO Take 1 tablet by mouth daily. Patient  not taking: Reported on 05/16/2023    [provider]    Current Outpatient Medications  Medication Sig Dispense Refill   acamprosate (CAMPRAL) 333 MG tablet Take 666 mg by mouth 3 (three) times daily with meals.     celecoxib (CELEBREX) 100 MG capsule Take 1 capsule (100 mg total) by mouth 2 (two) times daily. (Patient not taking: Reported on 05/16/2023) 30 capsule 1   cetirizine (ZYRTEC) 10 MG tablet Take by mouth.     COVID-19 mRNA bivalent vaccine, Pfizer, (PFIZER COVID-19 VAC BIVALENT) injection Inject into the muscle. 0.3 mL 0   diclofenac sodium (VOLTAREN) 1 % GEL Apply 2  g topically 4 (four) times daily. 1 Tube 5   ibuprofen (ADVIL) 800 MG tablet Take 1 tablet (800 mg total) by mouth every 8 (eight) hours as needed. 30 tablet 2   influenza vac split quadrivalent PF (FLUARIX QUADRIVALENT) 0.5 ML injection Inject into the muscle. 0.5 mL 0   lactobacillus acidophilus (BACID) TABS tablet Take 2 tablets by mouth 3 (three) times daily.     Magnesium Chloride 64 MG TBEC Take by mouth.     meloxicam (MOBIC) 15 MG tablet Take 15 mg by mouth daily.     metroNIDAZOLE (METROCREAM) 0.75 % cream Apply topically.     Multiple Vitamins-Minerals (ZINC PO) Take 1 tablet by mouth daily. (Patient not taking: Reported on 05/16/2023)     Na Sulfate-K Sulfate-Mg Sulf 17.5-3.13-1.6 GM/177ML SOLN Take by mouth as directed.     olopatadine (PATANOL) 0.1 % ophthalmic solution 1 drop into affected eye Ophthalmic Twice a day prn for 30 days     triamcinolone cream (KENALOG) 0.1 % Apply twice a day to rash as needed for itching. 15 g 0   VITAMIN D PO Take 1 tablet by mouth daily. (Patient not taking: Reported on 05/16/2023)     No current facility-administered medications for this visit.    Allergies as of 06/03/2023 - Review Complete 05/19/2023  Allergen Reaction Noted   Codeine Nausea Only 02/06/2015   Linaclotide Other (See Comments) 05/16/2023    Family History  Problem Relation Age of Onset   Colon polyps Mother    Colonic polyp Mother        11/2018   Stroke Mother    Heart attack Father    Diabetes Father    Other Father        vascular/cluster H/As   Stroke Father    Hyperlipidemia Brother    Breast cancer Neg Hx    Colon cancer Neg Hx    Esophageal cancer Neg Hx    Stomach cancer Neg Hx    Rectal cancer Neg Hx     Social History   Socioeconomic History   Marital status: Married    Spouse name: Not on file   Number of children: 1   Years of education: Not on file   Highest education level: Not on file  Occupational History   Not on file  Tobacco Use    Smoking status: Former   Smokeless tobacco: Never  Vaping Use   Vaping status: Never Used  Substance and Sexual Activity   Alcohol use: Yes    Alcohol/week: 2.0 standard drinks of alcohol    Types: 2 Standard drinks or equivalent per week   Drug use: No   Sexual activity: Not Currently    Partners: Male    Birth control/protection: Post-menopausal  Other Topics Concern   Not on file  Social History Narrative  Not on file   Social Determinants of Health   Financial Resource Strain: Not on file  Food Insecurity: Not on file  Transportation Needs: Not on file  Physical Activity: Not on file  Stress: Not on file  Social Connections: Unknown (11/12/2021)   Received from [facility], [facility]   Social Network    Social Network: Not on file  Intimate Partner Violence: Unknown (10/04/2021)   Received from [facility], [facility]   HITS    Physically Hurt: Not on file    Insult or Talk Down To: Not on file    Threaten Physical Harm: Not on file    Scream or Curse: Not on file    Review of Systems:  All other review of systems negative except as mentioned in the HPI.  Physical Exam: Vital signs in last 24 hours: BP 130/65   Pulse (!) 59   Temp (!) 97.5 F (36.4 C)   Resp 10   Ht 5\' 6"  (1.676 m)   Wt 126 lb (57.2 kg)   LMP 07/02/2007 (Approximate)   SpO2 100%   BMI 20.34 kg/m  General:   Alert, NAD Lungs:  Clear .   Heart:  Regular rate and rhythm Abdomen:  Soft, nontender and nondistended. Neuro/Psych:  Alert and cooperative. Normal mood and affect. A and O x 3  Reviewed labs, radiology imaging, old records and pertinent past GI work up  Patient is appropriate for planned procedure(s) and anesthesia in an ambulatory setting   K. Scherry Ran , MD [phone]

## 2023-06-04 ENCOUNTER — Telehealth: Payer: Self-pay

## 2023-06-04 NOTE — Telephone Encounter (Signed)
  Follow up Call-     06/03/2023    7:15 AM  Call back number  Post procedure Call Back phone  # [phone]  Permission to leave phone message Yes     Patient questions:  Do you have a fever, pain , or abdominal swelling? No. Pain Score  0 *  Have you tolerated food without any problems? Yes.    Have you been able to return to your normal activities? Yes.    Do you have any questions about your discharge instructions: Diet   No. Medications  No. Follow up visit  No.  Do you have questions or concerns about your Care? No.  Actions: * If pain score is 4 or above: No action needed, pain <4.

## 2023-06-05 LAB — SURGICAL PATHOLOGY

## 2023-06-13 ENCOUNTER — Encounter: Payer: Self-pay | Admitting: Gastroenterology

## 2023-07-02 DIAGNOSIS — K5720 Diverticulitis of large intestine with perforation and abscess without bleeding: Secondary | ICD-10-CM

## 2023-07-02 HISTORY — DX: Diverticulitis of large intestine with perforation and abscess without bleeding: K57.20

## 2023-08-25 ENCOUNTER — Other Ambulatory Visit (HOSPITAL_COMMUNITY): Payer: Self-pay | Admitting: Family Medicine

## 2023-08-25 DIAGNOSIS — E7889 Other lipoprotein metabolism disorders: Secondary | ICD-10-CM

## 2023-09-04 ENCOUNTER — Ambulatory Visit (HOSPITAL_COMMUNITY)
Admission: RE | Admit: 2023-09-04 | Discharge: 2023-09-04 | Disposition: A | Discharge status: Home / Self Care | Payer: Self-pay | Source: Ambulatory Visit | Attending: Family Medicine | Admitting: Family Medicine

## 2023-09-04 DIAGNOSIS — E7889 Other lipoprotein metabolism disorders: Secondary | ICD-10-CM | POA: Insufficient documentation

## 2023-10-07 ENCOUNTER — Other Ambulatory Visit: Payer: Self-pay | Admitting: Family Medicine

## 2023-10-07 DIAGNOSIS — R1030 Lower abdominal pain, unspecified: Secondary | ICD-10-CM

## 2023-10-09 ENCOUNTER — Ambulatory Visit
Admission: RE | Admit: 2023-10-09 | Discharge: 2023-10-09 | Disposition: A | Discharge status: Home / Self Care | Source: Ambulatory Visit | Attending: Family Medicine | Admitting: Family Medicine

## 2023-10-09 DIAGNOSIS — R1030 Lower abdominal pain, unspecified: Secondary | ICD-10-CM

## 2023-10-09 MED ORDER — IOPAMIDOL (ISOVUE-300) INJECTION 61%
75.0000 mL | Freq: Once | INTRAVENOUS | Status: AC | PRN
  Administered 2023-10-09: 75 mL via INTRAVENOUS

## 2023-10-14 ENCOUNTER — Telehealth: Payer: Self-pay | Admitting: Gastroenterology

## 2023-10-14 NOTE — Telephone Encounter (Signed)
 Inbound call from patient stating that her PCP had sent her for a CT scan of the abdomen pelvis and wanted to discuss the results with one of our nurses because the findings are more GI related. Patient is requesting a call back to discuss and see what next steps are. Please advise.

## 2023-10-14 NOTE — Telephone Encounter (Signed)
 Patient seen by her PCP for lower abdominal pain predominately on the left side. Her PCP did a check of her urine and an abdominal/pelvic CT with oral and IV contrast. She was treated with Augmentin for UTI and possible diverticulitis. She is feeling better. Her question was regarding the CT results and the gall bladder. See the report from  10/09/23. She will bring her concerns to the attention of the ordering physician and ask for an addendum if indicated.

## 2023-11-27 ENCOUNTER — Ambulatory Visit: Admitting: Podiatry

## 2023-11-27 ENCOUNTER — Ambulatory Visit (INDEPENDENT_AMBULATORY_CARE_PROVIDER_SITE_OTHER)

## 2023-11-27 DIAGNOSIS — M7752 Other enthesopathy of left foot: Secondary | ICD-10-CM

## 2023-11-27 NOTE — Progress Notes (Signed)
 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 female presents today concerned about developing left second toe deformity.  She is very active and used to be a distance runner.  She regularly wears metatarsal pads at this point to protect her forefoot.  She denies significant pain to the second toe today but notes that it becomes painful with increased activity.  She is also concerned about starting to float and deviate.  Past Medical History:  Diagnosis Date   Abnormal Pap smear of cervix 1997, 2006-2009   --1997 pt. had colpo/cryotherapy to cervix in [place], [place], 2006-2009 abn.paps(dysplasia) and colposcopies but no treatment to cervix--paps returned to normal   Arthritis    lumbarspine   Cataract    bilateral (early)   Diverticulosis    Genital warts 1984   Glaucoma    Suspect  at this time   Kidney stones    Osteopenia    hips and legs   Osteoporosis    wrists only   Scoliosis    STD (sexually transmitted disease)    Hx condyloma    Past Surgical History:  Procedure Laterality Date   CESAREAN SECTION  1991   GYNECOLOGIC CRYOSURGERY  1997   done in [place], [place]   MANDIBLE SURGERY Bilateral 2015   PELVIC LAPAROSCOPY  1995   d/t back pain--nl--in [place], [place]    Allergies  Allergen Reactions   Codeine Nausea Only   Linaclotide Other (See Comments)    Leg swelling    ROS    Physical Exam: There were no vitals filed for this visit.  General: The patient is alert and oriented x3 in no acute distress.  Dermatology: Skin is warm, dry and supple bilateral lower extremities. Interspaces are clear of maceration and debris.    Vascular: Palpable pedal pulses bilaterally. Capillary refill within normal limits.  No appreciable edema.  No erythema or calor.  Neurological: Light touch sensation grossly intact  bilateral feet.   Musculoskeletal Exam: Left foot slight deviation and splaying of 2nd and 3rd toes.  Some instability of second MPJ noted on Lachman maneuver.  No significant pain on palpation or with second toe range of motion.  Slight floating of second toe.  Radiographic Exam: Left foot 3 views weightbearing 11/27/23 Normal osseous mineralization. Joint spaces preserved.  No fractures or osseous irregularities noted.  Slight medial deviation of second MPJ, slight contracture MTPJ noted.  Assessment/Plan of Care: 1. Capsulitis of metatarsophalangeal (MTP) joint of left foot      No orders of the defined types were placed in this encounter.  None  Discussed clinical findings with patient today.  Radiographs reviewed with patient  Reviewed findings of predislocation syndrome with capsulitis subsecond MTPJ.  Corticosteroid injection offered to patient today which was deferred, she is not having significant pain today.  Did encourage continued use of metatarsal pads.    Second MTPJ was splinted using second toe capsulitis strapping with Coban into rectus position to stabilize the MTPJ.  Demonstrated technique to patient encouraged her to replicate this with athletic tape.  Continue with this over the next 3 weeks follow-up at this time   Digby Groeneveld L. Lunda Salines, AACFAS [facility]     [address]  [place], [place] [address]                Office [phone]  Fax [phone]

## 2023-11-30 ENCOUNTER — Encounter: Payer: Self-pay | Admitting: Podiatry

## 2023-11-30 NOTE — Addendum Note (Signed)
 Addended by: Reina Cara on: 11/30/2023 12:17 PM   Modules accepted: Level of Service

## 2023-12-17 ENCOUNTER — Other Ambulatory Visit (HOSPITAL_BASED_OUTPATIENT_CLINIC_OR_DEPARTMENT_OTHER): Payer: Self-pay

## 2023-12-17 ENCOUNTER — Telehealth: Payer: Self-pay | Admitting: Gastroenterology

## 2023-12-17 NOTE — Telephone Encounter (Signed)
 Patient returned call. She would like to know about how to get testing for possible celiac disease due to continued diarrhea. Pt has not been seen in clinic since 2019. Appointment scheduled with PA to discuss further.

## 2023-12-17 NOTE — Telephone Encounter (Signed)
 Attempted to reach patient. No answer, left VM for patient to return call.

## 2023-12-17 NOTE — Telephone Encounter (Signed)
 Patient called and stated that she would like to know if she can have additional allergy food testing to seeing what food she might be allergic to that can be causing her diarrhea. Patient is requesting a call back. Please advise.

## 2023-12-18 ENCOUNTER — Ambulatory Visit: Admitting: Physician Assistant

## 2023-12-18 ENCOUNTER — Encounter: Payer: Self-pay | Admitting: Physician Assistant

## 2023-12-18 VITALS — BP 98/62 | HR 69 | Ht 66.0 in | Wt 123.0 lb

## 2023-12-18 DIAGNOSIS — R110 Nausea: Secondary | ICD-10-CM

## 2023-12-18 DIAGNOSIS — R509 Fever, unspecified: Secondary | ICD-10-CM | POA: Diagnosis not present

## 2023-12-18 DIAGNOSIS — R1013 Epigastric pain: Secondary | ICD-10-CM

## 2023-12-18 DIAGNOSIS — R194 Change in bowel habit: Secondary | ICD-10-CM | POA: Diagnosis not present

## 2023-12-18 NOTE — Patient Instructions (Signed)
 Follow as needed.  Thank you for trusting me with your gastrointestinal care!  Reginal Capra, PA-C  _______________________________________________________  If your blood pressure at your visit was 140/90 or greater, please contact your primary care physician to follow up on this.  _______________________________________________________  If you are age 66 or older, your body mass index should be between 23-30. Your Body mass index is 19.85 kg/m. If this is out of the aforementioned range listed, please consider follow up with your Primary Care Provider.  If you are age 69 or younger, your body mass index should be between 19-25. Your Body mass index is 19.85 kg/m. If this is out of the aformentioned range listed, please consider follow up with your Primary Care Provider.   ________________________________________________________  The [facility] providers would like to encourage you to use MYCHART to communicate with providers for non-urgent requests or questions.  Due to long hold times on the telephone, sending your provider a message by [facility] may be a faster and more efficient way to get a response.  Please allow 48 business hours for a response.  Please remember that this is for non-urgent requests.  _______________________________________________________

## 2023-12-18 NOTE — Progress Notes (Signed)
 Chief Complaint: Diarrhea, abdominal pain and nausea  HPI:    Brenda Dyer is a 68 year old female with a past medical history as listed below including osteoporosis on multiple others, known to Dr. Leonia Raman, who was referred to me by Emmette Harms* for a complaint of abdominal pain, nausea and diarrhea.      03/10/2018 office visit with Dr. Nandigam for constipation.  Apparently chronic constipation with no improvement with MiraLAX.  At that point recommended colonoscopy, fiber and daily MiraLAX.    06/03/2023 colonoscopy done for history of sessile serrated polyp with one 3 mm polyp in the rectum, severe diverticulosis in the sigmoid and descending colon with narrowing in association with diverticular opening and evidence of diverticular spasm, nonbleeding external and internal hemorrhoids.  Pathology showed hyperplastic polyp.  Repeat recommended 10 years.    10/16/2023 CTAP with contrast.  Addendum noted that the gallbladder was unremarkable.  Findings favored subacute/smoldering diverticulitis of the proximal sigmoid colon.  Multiple other nonacute observations.    Today, patient presents to clinic and tells me that back in April she was on Augmentin for suspected UTI/diverticulitis.  Tells me over the past 4 to 6 weeks she has noticed a change in bowel habits tells me that typically she is chronically constipated and uses daily MiraLAX and a probiotic, but now she can go having normal bowel movement in the morning and then within the next hour have a blowout stool with an odd odor.  This happens maybe 2 or 3 days a week but tells me that when she feels like this is going to happen she cannot trust it and cannot go to the gym right away as she has to wait.  She also tells me she has had family in her house for 2 weeks and traveled for 2 weeks out of the country and was maybe not on her regular regimen.  Occasionally can still feel some left lower quadrant discomfort that comes and goes.     Also discusses that on Saturday, 12/13/2023 she had some epigastric pain and a low-grade fever with a dull headache and just laid around the next day, everything seemed to go away.  Did have [employer]ipot[employer] the night before.    Denies fever, chills, weight loss or blood in her stool.  Past Medical History:  Diagnosis Date   Abnormal Pap smear of cervix 1997, 2006-2009   --1997 pt. had colpo/cryotherapy to cervix in [place], [place], 2006-2009 abn.paps(dysplasia) and colposcopies but no treatment to cervix--paps returned to normal   Arthritis    lumbarspine   Cataract    bilateral (early)   Diverticulosis    Genital warts 1984   Glaucoma    Suspect  at this time   Kidney stones    Osteopenia    hips and legs   Osteoporosis    wrists only   Scoliosis    STD (sexually transmitted disease)    Hx condyloma    Past Surgical History:  Procedure Laterality Date   CESAREAN SECTION  1991   GYNECOLOGIC CRYOSURGERY  1997   done in [place], [place]   MANDIBLE SURGERY Bilateral 2015   PELVIC LAPAROSCOPY  1995   d/t back pain--nl--in [place], [place]    Current Outpatient Medications  Medication Sig Dispense Refill   celecoxib  (CELEBREX ) 100 MG capsule Take 1 capsule (100 mg total) by mouth 2 (two) times daily. 30 capsule 1   cetirizine (ZYRTEC) 10 MG tablet Take by mouth.     COVID-19  mRNA bivalent vaccine, Pfizer, (PFIZER COVID-19 VAC BIVALENT) injection Inject into the muscle. 0.3 mL 0   diclofenac  sodium (VOLTAREN ) 1 % GEL Apply 2 g topically 4 (four) times daily. 1 Tube 5   ibuprofen  (ADVIL ) 800 MG tablet Take 1 tablet (800 mg total) by mouth every 8 (eight) hours as needed. 30 tablet 2   influenza vac split quadrivalent PF (FLUARIX  QUADRIVALENT) 0.5 ML injection Inject into the muscle. 0.5 mL 0   lactobacillus acidophilus (BACID) TABS tablet Take 2 tablets by mouth 3 (three) times daily.     Magnesium Chloride 64 MG TBEC Take by mouth.     meloxicam (MOBIC) 15 MG tablet Take 15 mg by mouth daily. (Patient  taking differently: Take 15 mg by mouth daily. Pt taking prn)     metroNIDAZOLE (METROCREAM) 0.75 % cream Apply topically.     Multiple Vitamins-Minerals (ZINC PO) Take 1 tablet by mouth daily.     olopatadine (PATANOL) 0.1 % ophthalmic solution      triamcinolone  cream (KENALOG ) 0.1 % Apply twice a day to rash as needed for itching. 15 g 0   VITAMIN D  PO Take 1 tablet by mouth daily.     acamprosate (CAMPRAL) 333 MG tablet Take 666 mg by mouth 3 (three) times daily with meals. (Patient not taking: Reported on 12/18/2023)     No current facility-administered medications for this visit.    Allergies as of 12/18/2023 - Review Complete 12/18/2023  Allergen Reaction Noted   Codeine Nausea Only 02/06/2015   Linaclotide Other (See Comments) 05/16/2023    Family History  Problem Relation Age of Onset   Colon polyps Mother    Colonic polyp Mother        11/2018   Stroke Mother    Heart attack Father    Diabetes Father    Other Father        vascular/cluster H/As   Stroke Father    Hyperlipidemia Brother    Breast cancer Neg Hx    Colon cancer Neg Hx    Esophageal cancer Neg Hx    Stomach cancer Neg Hx    Rectal cancer Neg Hx     Social History   Socioeconomic History   Marital status: Married    Spouse name: Not on file   Number of children: 1   Years of education: Not on file   Highest education level: Not on file  Occupational History   Not on file  Tobacco Use   Smoking status: Former   Smokeless tobacco: Never  Vaping Use   Vaping status: Never Used  Substance and Sexual Activity   Alcohol use: Yes    Alcohol/week: 2.0 standard drinks of alcohol    Types: 2 Standard drinks or equivalent per week   Drug use: No   Sexual activity: Not Currently    Partners: Male    Birth control/protection: Post-menopausal  Other Topics Concern   Not on file  Social History Narrative   Not on file   Social Drivers of Health   Financial Resource Strain: Not on file  Food  Insecurity: Not on file  Transportation Needs: Not on file  Physical Activity: Not on file  Stress: Not on file  Social Connections: Unknown (11/12/2021)   [facility]   Social Network    Social Network: Not on file  Intimate Partner Violence: Unknown (10/04/2021)   Received from [facility]   HITS    Physically Hurt: Not on file  Insult or Talk Down To: Not on file    Threaten Physical Harm: Not on file    Scream or Curse: Not on file    Review of Systems:    Constitutional: No weight loss, fever or chills Cardiovascular: No chest pain Respiratory: No SOB  Gastrointestinal: See HPI and otherwise negative   Physical Exam:  Vital signs: BP 98/62   Pulse 69   Ht 5' 6 (1.676 m)   Wt 123 lb (55.8 kg)   LMP 07/02/2007 (Approximate)   BMI 19.85 kg/m    Constitutional:   Pleasant Caucasian female appears to be in NAD, Well developed, Well nourished, alert and cooperative Head:  Normocephalic and atraumatic. Eyes:   PEERL, EOMI. No icterus. Conjunctiva pink. Ears:  Normal auditory acuity. Neck:  Supple Throat: Oral cavity and pharynx without inflammation, swelling or lesion.  Respiratory: Respirations even and unlabored. Lungs clear to auscultation bilaterally.   No wheezes, crackles, or rhonchi.  Cardiovascular: Normal S1, S2. No MRG. Regular rate and rhythm. No peripheral edema, cyanosis or pallor.  Gastrointestinal:  Soft, nondistended, nontender. No rebound or guarding. Normal bowel sounds. No appreciable masses or hepatomegaly. Rectal:  Not performed.  Msk:  Symmetrical without gross deformities. Without edema, no deformity or joint abnormality.  Neurologic:  Alert and  oriented x4;  grossly normal neurologically.  Skin:   Dry and intact without significant lesions or rashes. Psychiatric:  Demonstrates good judgement and reason without abnormal affect or behaviors.  RELEVANT LABS AND IMAGING: CBC    Component Value Date/Time   WBC 4.4 05/19/2023 1551    RBC 3.93 05/19/2023 1551   HGB 12.3 05/19/2023 1551   HCT 37.4 05/19/2023 1551   PLT 240 05/19/2023 1551   MCV 95.2 05/19/2023 1551   MCH 31.3 05/19/2023 1551   MCHC 32.9 05/19/2023 1551   RDW 11.9 05/19/2023 1551    CMP     Component Value Date/Time   NA 139 05/19/2023 1551   K 4.1 05/19/2023 1551   CL 102 05/19/2023 1551   CO2 28 05/19/2023 1551   GLUCOSE 85 05/19/2023 1551   BUN 27 (H) 05/19/2023 1551   CREATININE 0.82 05/19/2023 1551   CALCIUM 9.9 05/19/2023 1551   PROT 6.9 05/19/2023 1551   AST 25 05/19/2023 1551   ALT 19 05/19/2023 1551   BILITOT 0.4 05/19/2023 1551    Assessment: 1.  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  Epigastric pain with nausea and a low-grade fever: Likely viral gastroenteritis or related to food she ate, this is now gone  Plan: 1.  We discussed that all this could be related to the strong antibiotic she took.  She had not thought about the timing of that but it does fit.  She has no red flag symptoms.  Would recommend that she get back on her regular regimen and see how things go over the next couple of months, still worried about a change in bowel habits she will call and let us  know 2.  Briefly discussed epigastric pain/nausea/low-grade fever, sounds like something viral and/or something she ate.  Regardless the symptoms are gone now. 3.  Patient did ask about an emergency prescription for antibiotics for diverticulitis if she leaves the country.  Tends to leave the country 3 times  a year.  Discussed that if she is feeling anxious prior to her next trip I would be happy to give her some Cipro/Flagyl to take with. 4.  Patient to follow in clinic with us  as needed.  Reginal Capra, PA-C [facility] 12/18/2023, 2:21 PM  Cc: Jorie Newness, [place]*

## 2023-12-19 ENCOUNTER — Other Ambulatory Visit (HOSPITAL_BASED_OUTPATIENT_CLINIC_OR_DEPARTMENT_OTHER): Payer: Self-pay

## 2023-12-23 ENCOUNTER — Other Ambulatory Visit (HOSPITAL_BASED_OUTPATIENT_CLINIC_OR_DEPARTMENT_OTHER): Payer: Self-pay

## 2023-12-23 MED ORDER — TRIAMCINOLONE ACETONIDE 0.1 % EX CREA
TOPICAL_CREAM | CUTANEOUS | 0 refills | Status: AC
  Filled 2023-12-23: qty 80, 30d supply, fill #0

## 2024-01-20 ENCOUNTER — Other Ambulatory Visit (HOSPITAL_BASED_OUTPATIENT_CLINIC_OR_DEPARTMENT_OTHER): Payer: Self-pay

## 2024-02-24 ENCOUNTER — Ambulatory Visit: Admitting: Sports Medicine

## 2024-02-24 VITALS — BP 108/74 | Ht 66.0 in | Wt 125.0 lb

## 2024-02-24 DIAGNOSIS — M7741 Metatarsalgia, right foot: Secondary | ICD-10-CM | POA: Diagnosis not present

## 2024-02-24 DIAGNOSIS — M7742 Metatarsalgia, left foot: Secondary | ICD-10-CM

## 2024-02-24 DIAGNOSIS — M216X2 Other acquired deformities of left foot: Secondary | ICD-10-CM | POA: Diagnosis not present

## 2024-02-24 NOTE — Progress Notes (Signed)
 PCP: Cathlyn JAYSON Nikki Bobie FORBES, MD  Subjective:   HPI: Patient is a 68 y.o. female here for bilateral foot pain, left worse than right.  Stemming from the left second toe.  Patient has had this issue for about 10 years.  Has been worsening in the last month.  She previously has been evaluated by Dr. Burnetta, as well as podiatry who diagnosed her with second MTPJ capsulitis.  She did have a steroid injections done in 2023 by Dr. Burnetta which she said provided relief.  She has been trying to do some home therapy and stretches but she feels that she may have worsened this.  Past Medical History:  Diagnosis Date   Abnormal Pap smear of cervix 1997, 2006-2009   --1997 pt. had colpo/cryotherapy to cervix in [place], [place], 2006-2009 abn.paps(dysplasia) and colposcopies but no treatment to cervix--paps returned to normal   Arthritis    lumbarspine   Cataract    bilateral (early)   Diverticulosis    Genital warts 1984   Glaucoma    Suspect  at this time   Kidney stones    Osteopenia    hips and legs   Osteoporosis    wrists only   Scoliosis    STD (sexually transmitted disease)    Hx condyloma    Current Outpatient Medications on File Prior to Visit  Medication Sig Dispense Refill   acamprosate (CAMPRAL) 333 MG tablet Take 666 mg by mouth 3 (three) times daily with meals. (Patient not taking: Reported on 12/18/2023)     celecoxib  (CELEBREX ) 100 MG capsule Take 1 capsule (100 mg total) by mouth 2 (two) times daily. 30 capsule 1   cetirizine (ZYRTEC) 10 MG tablet Take by mouth.     COVID-19 mRNA bivalent vaccine, Pfizer, (PFIZER COVID-19 VAC BIVALENT) injection Inject into the muscle. 0.3 mL 0   diclofenac  sodium (VOLTAREN ) 1 % GEL Apply 2 g topically 4 (four) times daily. 1 Tube 5   ibuprofen  (ADVIL ) 800 MG tablet Take 1 tablet (800 mg total) by mouth every 8 (eight) hours as needed. 30 tablet 2   influenza vac split quadrivalent PF (FLUARIX  QUADRIVALENT) 0.5 ML injection Inject into the  muscle. 0.5 mL 0   lactobacillus acidophilus (BACID) TABS tablet Take 2 tablets by mouth 3 (three) times daily.     Magnesium Chloride 64 MG TBEC Take by mouth.     meloxicam (MOBIC) 15 MG tablet Take 15 mg by mouth daily. (Patient taking differently: Take 15 mg by mouth daily. Pt taking prn)     metroNIDAZOLE (METROCREAM) 0.75 % cream Apply topically.     Multiple Vitamins-Minerals (ZINC PO) Take 1 tablet by mouth daily.     olopatadine (PATANOL) 0.1 % ophthalmic solution      triamcinolone  cream (KENALOG ) 0.1 % Apply twice a day to rash as needed for itching. 15 g 0   triamcinolone  cream (KENALOG ) 0.1 % Apply to rash twice daily as needed for itch 80 g 0   VITAMIN D  PO Take 1 tablet by mouth daily.     No current facility-administered medications on file prior to visit.    BP 108/74   Ht 5' 6 (1.676 m)   Wt 125 lb (56.7 kg)   LMP 07/02/2007 (Approximate)   BMI 20.18 kg/m        Objective:   Physical Exam:  Gen: NAD, comfortable in exam room Bilateral feet  On inspection of bilateral feet with standing, patient has splaying of the 2nd  and 3rd toe bilaterally, left second toe has medial deviation towards the first great toe, right fifth bunionette present, which is subluxing with lateral rotation.  Long arch is maintained, plantar plate breakdown present with forefoot widening.  Inspection: Free from effusion, erythema, edema or warmth, Morton's callus present both right and left Palpation: Patient is tenderness to palpation over the second plantar MTP joint ROM: Full plantarflexion, full dorsiflexion, inversion and eversion, second toe on the left has full motion with flexion and extension without pain Neuro: Strength equal bilaterally, sensation intact bilaterally  Assessment/Plan:   Brenda Dyer is a 68 y.o. female who was seen today for the following: 1. Loss of transverse plantar arch of left foot (Primary) 2. Metatarsalgia of both feet - Patient had bilateral  tarsal pads placed in her normal insoles which did not provide much arch support - Fitted patient with support arch supports with MT cookie pads bilaterally - Discussed options with patient and steroid injection although this should be limited due to the small joint space - She will refrain from steroid injections and attempt to use the orthotics with padding provided today - If patient does not have significant relief or improvement, we did discuss custom orthotics as being an option before steroid injections - Return as needed  Follow-up/Education:   Return if symptoms worsen or fail to improve.   May return sooner as needed and encouraged to call/e-mail for additional questions or  worsening symptoms in the interim.  Krystal Lowing, DO Sports Medicine Fellow 02/24/2024 1:01 PM

## 2024-03-16 ENCOUNTER — Encounter: Payer: Self-pay | Admitting: Sports Medicine

## 2024-04-13 ENCOUNTER — Telehealth: Payer: Self-pay | Admitting: Physician Assistant

## 2024-04-13 ENCOUNTER — Other Ambulatory Visit: Payer: Self-pay

## 2024-04-13 MED ORDER — AMOXICILLIN-POT CLAVULANATE 875-125 MG PO TABS: 1.0000 | ORAL_TABLET | Freq: Two times a day (BID) | ORAL | 0 refills | Status: DC

## 2024-04-13 NOTE — Telephone Encounter (Signed)
Patient instructed and agrees to this plan of care.

## 2024-04-13 NOTE — Telephone Encounter (Signed)
 Spoke with the patient. She began having a pressure sensation and changes in bowel habit about 2 weeks ago. Symptoms became considerably worse 4 days ago. She has gone from sluggish bowels to constipation, left lower abdominal pain and pressure. Tells me this is same symptoms as happened the last time I had diverticulitis. She used a glycerin suppository yesterday and today to produce a bowel movement. She has painful gas. She is taking ibuprofen  because her pain is so intense now. She stopped all fiber. Agrees to take a full dose of Miralax and go to a clear liquid diet for now.

## 2024-04-13 NOTE — Telephone Encounter (Signed)
 Inbound call from patient stating she's on day 4 of her diverticulitis flare up and the mornings are the worse, patient states that its to the point where she feels she needs to go to the ER. Patient would like to speak to nurse and be advised on what to do and what to take. Requesting a call back  Please advise  Thank you

## 2024-04-13 NOTE — Telephone Encounter (Signed)
 Augmentin 875 mg BID X 10 days, please schedule office follow up visit next week and advise patient to call if no improvement or worsening symptoms. Will plan for CT abd & pelvis with contrast if that's the case

## 2024-04-15 ENCOUNTER — Inpatient Hospital Stay (HOSPITAL_COMMUNITY)
Admission: EM | Admit: 2024-04-15 | Discharge: 2024-04-18 | DRG: 392 | Disposition: A | Discharge status: Home / Self Care | Source: Ambulatory Visit | Attending: Hospitalist | Admitting: Hospitalist

## 2024-04-15 ENCOUNTER — Other Ambulatory Visit: Payer: Self-pay

## 2024-04-15 ENCOUNTER — Telehealth: Payer: Self-pay | Admitting: Physician Assistant

## 2024-04-15 ENCOUNTER — Encounter: Payer: Self-pay | Admitting: Gastroenterology

## 2024-04-15 ENCOUNTER — Emergency Department (HOSPITAL_COMMUNITY)

## 2024-04-15 ENCOUNTER — Encounter (HOSPITAL_COMMUNITY): Payer: Self-pay | Admitting: Radiology

## 2024-04-15 DIAGNOSIS — Z823 Family history of stroke: Secondary | ICD-10-CM

## 2024-04-15 DIAGNOSIS — K5792 Diverticulitis of intestine, part unspecified, without perforation or abscess without bleeding: Principal | ICD-10-CM

## 2024-04-15 DIAGNOSIS — M419 Scoliosis, unspecified: Secondary | ICD-10-CM | POA: Diagnosis present

## 2024-04-15 DIAGNOSIS — K5720 Diverticulitis of large intestine with perforation and abscess without bleeding: Principal | ICD-10-CM | POA: Diagnosis present

## 2024-04-15 DIAGNOSIS — K529 Noninfective gastroenteritis and colitis, unspecified: Secondary | ICD-10-CM | POA: Diagnosis present

## 2024-04-15 DIAGNOSIS — Z888 Allergy status to other drugs, medicaments and biological substances status: Secondary | ICD-10-CM | POA: Diagnosis not present

## 2024-04-15 DIAGNOSIS — Z79899 Other long term (current) drug therapy: Secondary | ICD-10-CM

## 2024-04-15 DIAGNOSIS — Z8249 Family history of ischemic heart disease and other diseases of the circulatory system: Secondary | ICD-10-CM | POA: Diagnosis not present

## 2024-04-15 DIAGNOSIS — Z885 Allergy status to narcotic agent status: Secondary | ICD-10-CM | POA: Diagnosis not present

## 2024-04-15 DIAGNOSIS — Z833 Family history of diabetes mellitus: Secondary | ICD-10-CM | POA: Diagnosis not present

## 2024-04-15 DIAGNOSIS — Z87891 Personal history of nicotine dependence: Secondary | ICD-10-CM

## 2024-04-15 DIAGNOSIS — R1900 Intra-abdominal and pelvic swelling, mass and lump, unspecified site: Secondary | ICD-10-CM

## 2024-04-15 DIAGNOSIS — Z791 Long term (current) use of non-steroidal anti-inflammatories (NSAID): Secondary | ICD-10-CM | POA: Diagnosis not present

## 2024-04-15 DIAGNOSIS — M810 Age-related osteoporosis without current pathological fracture: Secondary | ICD-10-CM | POA: Diagnosis present

## 2024-04-15 DIAGNOSIS — R112 Nausea with vomiting, unspecified: Secondary | ICD-10-CM | POA: Diagnosis not present

## 2024-04-15 DIAGNOSIS — Z83438 Family history of other disorder of lipoprotein metabolism and other lipidemia: Secondary | ICD-10-CM | POA: Diagnosis not present

## 2024-04-15 DIAGNOSIS — R197 Diarrhea, unspecified: Secondary | ICD-10-CM | POA: Diagnosis not present

## 2024-04-15 DIAGNOSIS — Z860102 Personal history of hyperplastic colon polyps: Secondary | ICD-10-CM | POA: Diagnosis not present

## 2024-04-15 LAB — COMPREHENSIVE METABOLIC PANEL WITH GFR
ALT: 33 U/L (ref 0–44)
AST: 31 U/L (ref 15–41)
Albumin: 4.5 g/dL (ref 3.5–5.0)
Alkaline Phosphatase: 84 U/L (ref 38–126)
Anion gap: 14 (ref 5–15)
BUN: 12 mg/dL (ref 8–23)
CO2: 23 mmol/L (ref 22–32)
Calcium: 10.4 mg/dL — ABNORMAL HIGH (ref 8.9–10.3)
Chloride: 100 mmol/L (ref 98–111)
Creatinine, Ser: 0.69 mg/dL (ref 0.44–1.00)
GFR, Estimated: >60 mL/min (ref >60)
Glucose, Bld: 112 mg/dL — ABNORMAL HIGH (ref 70–99)
Potassium: 3.8 mmol/L (ref 3.5–5.1)
Sodium: 137 mmol/L (ref 135–145)
Total Bilirubin: 0.7 mg/dL (ref 0.0–1.2)
Total Protein: 7.9 g/dL (ref 6.5–8.1)

## 2024-04-15 LAB — SEDIMENTATION RATE: Sed Rate: 35 mm/h — ABNORMAL HIGH (ref 0–22)

## 2024-04-15 LAB — CBC
HCT: 42.3 % (ref 36.0–46.0)
Hemoglobin: 13.7 g/dL (ref 12.0–15.0)
MCH: 30.1 pg (ref 26.0–34.0)
MCHC: 32.4 g/dL (ref 30.0–36.0)
MCV: 93 fL (ref 80.0–100.0)
Platelets: 373 K/uL (ref 150–400)
RBC: 4.55 MIL/uL (ref 3.87–5.11)
RDW: 11.9 % (ref 11.5–15.5)
WBC: 11.7 K/uL — ABNORMAL HIGH (ref 4.0–10.5)
nRBC: 0 % (ref 0.0–0.2)

## 2024-04-15 LAB — C-REACTIVE PROTEIN: CRP: 15.2 mg/dL — ABNORMAL HIGH (ref <1.0)

## 2024-04-15 LAB — LIPASE, BLOOD: Lipase: 29 U/L (ref 11–51)

## 2024-04-15 LAB — HIV ANTIBODY (ROUTINE TESTING W REFLEX): HIV Screen 4th Generation wRfx: NONREACTIVE

## 2024-04-15 MED ORDER — HYDROMORPHONE HCL 1 MG/ML IJ SOLN
0.5000 mg | Freq: Once | INTRAMUSCULAR | Status: AC
  Administered 2024-04-15: 0.5 mg via INTRAVENOUS
  Filled 2024-04-15: qty 1

## 2024-04-15 MED ORDER — OXYCODONE HCL 5 MG PO TABS
5.0000 mg | ORAL_TABLET | ORAL | Status: DC | PRN
  Administered 2024-04-16 – 2024-04-17 (×2): 5 mg via ORAL
  Filled 2024-04-15 (×2): qty 1

## 2024-04-15 MED ORDER — ONDANSETRON HCL 4 MG/2ML IJ SOLN
4.0000 mg | Freq: Once | INTRAMUSCULAR | Status: AC | PRN
  Administered 2024-04-15: 4 mg via INTRAVENOUS
  Filled 2024-04-15: qty 2

## 2024-04-15 MED ORDER — ALUM & MAG HYDROXIDE-SIMETH 200-200-20 MG/5ML PO SUSP
30.0000 mL | Freq: Once | ORAL | Status: AC
  Administered 2024-04-15: 30 mL via ORAL
  Filled 2024-04-15: qty 30

## 2024-04-15 MED ORDER — ACETAMINOPHEN 325 MG PO TABS
650.0000 mg | ORAL_TABLET | Freq: Four times a day (QID) | ORAL | Status: DC | PRN
Start: 2024-04-15 — End: 2024-04-18
  Administered 2024-04-16 – 2024-04-17 (×3): 650 mg via ORAL
  Filled 2024-04-15 (×3): qty 2

## 2024-04-15 MED ORDER — ENOXAPARIN SODIUM 40 MG/0.4ML IJ SOSY: 40.0000 mg | PREFILLED_SYRINGE | INTRAMUSCULAR | Status: DC

## 2024-04-15 MED ORDER — PIPERACILLIN-TAZOBACTAM 3.375 G IVPB
3.3750 g | Freq: Three times a day (TID) | INTRAVENOUS | Status: DC
  Administered 2024-04-16 – 2024-04-18 (×8): 3.375 g via INTRAVENOUS
  Filled 2024-04-15 (×8): qty 50

## 2024-04-15 MED ORDER — ACETAMINOPHEN 650 MG RE SUPP: 650.0000 mg | Freq: Four times a day (QID) | RECTAL | Status: DC | PRN

## 2024-04-15 MED ORDER — PIPERACILLIN-TAZOBACTAM 3.375 G IVPB 30 MIN
3.3750 g | Freq: Once | INTRAVENOUS | Status: AC
  Administered 2024-04-15: 3.375 g via INTRAVENOUS
  Filled 2024-04-15: qty 50

## 2024-04-15 MED ORDER — SENNOSIDES-DOCUSATE SODIUM 8.6-50 MG PO TABS
1.0000 | ORAL_TABLET | Freq: Every evening | ORAL | Status: DC | PRN
Start: 2024-04-15 — End: 2024-04-18

## 2024-04-15 MED ORDER — LIDOCAINE VISCOUS HCL 2 % MT SOLN
15.0000 mL | Freq: Four times a day (QID) | OROMUCOSAL | Status: AC | PRN
  Administered 2024-04-15: 15 mL via ORAL
  Filled 2024-04-15 (×2): qty 15

## 2024-04-15 MED ORDER — IOHEXOL 300 MG/ML  SOLN
100.0000 mL | Freq: Once | INTRAMUSCULAR | Status: AC | PRN
  Administered 2024-04-15: 100 mL via INTRAVENOUS

## 2024-04-15 MED ORDER — HYDROMORPHONE HCL 1 MG/ML IJ SOLN
0.5000 mg | INTRAMUSCULAR | Status: DC | PRN
  Administered 2024-04-15: 1 mg via INTRAVENOUS
  Filled 2024-04-15: qty 1

## 2024-04-15 MED ORDER — ALUM & MAG HYDROXIDE-SIMETH 200-200-20 MG/5ML PO SUSP
30.0000 mL | Freq: Four times a day (QID) | ORAL | Status: DC | PRN
  Administered 2024-04-15: 30 mL via ORAL
  Filled 2024-04-15: qty 30

## 2024-04-15 MED ORDER — LIDOCAINE VISCOUS HCL 2 % MT SOLN
15.0000 mL | Freq: Once | OROMUCOSAL | Status: AC
Start: 2024-04-15 — End: 2024-04-15
  Administered 2024-04-15: 15 mL via ORAL
  Filled 2024-04-15: qty 15

## 2024-04-15 MED ORDER — LACTATED RINGERS IV BOLUS
1000.0000 mL | Freq: Once | INTRAVENOUS | Status: AC
  Administered 2024-04-15: 1000 mL via INTRAVENOUS

## 2024-04-15 NOTE — ED Triage Notes (Signed)
 Pt ambulatory to triage with complaints of intermittent waves of abdominal pain, nausea and dry heaves. Pt states that she is currently being treated for diverticulitis, and was directed to the ER by her gastroenterologist (Nandigam) at [facility].

## 2024-04-15 NOTE — Telephone Encounter (Signed)
 Spoke with pt. Gave her provider recommendation to go to ER for further evaluation. Pt verbalized understanding. States she will proceed to [facility] ER.

## 2024-04-15 NOTE — H&P (Signed)
 History and Physical    Brenda Dyer FMW:[id number] DOB: [birth year] DOA: 04/15/2024  DOS: the patient was seen and examined on 04/15/2024  PCP: Cathlyn JAYSON Nikki Bobie FORBES, MD   Patient coming from: Home  I have personally briefly reviewed patient's old medical records in [facility] and CareEverywhere  HPI:   [facility] Brenda Dyer is a 68 y.o. year old female without significant past medical history presenting to the ED with worsening abdominal pain found to have image findings concerning for colitis with abscess development vs neoplasm.  Pt states 2 episodes in her life. Last episode in April 2025. No fevers, or chills. Abdominal pain since Saturday. Pain mostly in the am and late at night. Had diarrhea since today with around 14 bowel movements. No bloody. Since Tuesday took miralax twice daily.    ED Course: On arrival to the ED patient was noted to be HDS stable. Abdominal imaging was done that showed colitis with 6.9 x 1.5 x 1.8 cm lesion in sigmoid colon concerning for abscess vs neoplasm. Lipase was normal, CBC with mild leukocytosis, CMP with mild hypercalcemia. EDP discussed case with GI as they referred her to ED and given the image findings they want patient to be admitted and they will see in consultation.   TR[facility] contacted for admission.  Review of Systems: As mentioned in the history of present illness. All other systems reviewed and are negative.   Past Medical History:  Diagnosis Date   Abnormal Pap smear of cervix 1997, 2006-2009   --1997 pt. had colpo/cryotherapy to cervix in [place], [place], 2006-2009 abn.paps(dysplasia) and colposcopies but no treatment to cervix--paps returned to normal   Arthritis    lumbarspine   Cataract    bilateral (early)   Diverticulosis    Genital warts 1984   Glaucoma    Suspect  at this time   Kidney stones    Osteopenia    hips and legs   Osteoporosis    wrists only   Scoliosis    STD (sexually transmitted disease)     Hx condyloma    Past Surgical History:  Procedure Laterality Date   CESAREAN SECTION  1991   GYNECOLOGIC CRYOSURGERY  1997   done in [place], [place]   MANDIBLE SURGERY Bilateral 2015   PELVIC LAPAROSCOPY  1995   d/t back pain--nl--in [place], [place]     Allergies  Allergen Reactions   Codeine Nausea Only   Linaclotide Other (See Comments)    Leg swelling    Family History  Problem Relation Age of Onset   Colon polyps Mother    Colonic polyp Mother        11/2018   Stroke Mother    Heart attack Father    Diabetes Father    Other Father        vascular/cluster H/As   Stroke Father    Hyperlipidemia Brother    Breast cancer Neg Hx    Colon cancer Neg Hx    Esophageal cancer Neg Hx    Stomach cancer Neg Hx    Rectal cancer Neg Hx    Mother: CVA, prediabetes No hx of cancer  Prior to Admission medications   Medication Sig Start Date End Date Taking? Authorizing Provider  amoxicillin-clavulanate (AUGMENTIN) 875-125 MG tablet Take 1 tablet by mouth 2 (two) times daily. 04/13/24   Nandigam, Kavitha V, MD  acamprosate (CAMPRAL) 333 MG tablet Take 666 mg by mouth 3 (three) times daily with meals. Patient not taking:  Reported on 12/18/2023    [provider]  celecoxib  (CELEBREX ) 100 MG capsule Take 1 capsule (100 mg total) by mouth 2 (two) times daily. 09/17/22   Brooks, Dana, DO  cetirizine (ZYRTEC) 10 MG tablet Take by mouth.    [provider]  COVID-19 mRNA bivalent vaccine, Pfizer, (PFIZER COVID-19 VAC BIVALENT) injection Inject into the muscle. 05/25/21   Luiz Channel, MD  diclofenac  sodium (VOLTAREN ) 1 % GEL Apply 2 g topically 4 (four) times daily. 08/25/17   Jerri Kay HERO, MD  ibuprofen  (ADVIL ) 800 MG tablet Take 1 tablet (800 mg total) by mouth every 8 (eight) hours as needed. 02/29/20   Jerri Kay HERO, MD  influenza vac split quadrivalent PF (FLUARIX  QUADRIVALENT) 0.5 ML injection Inject into the muscle. 05/11/21     lactobacillus acidophilus (BACID) TABS  tablet Take 2 tablets by mouth 3 (three) times daily.    [provider]  Magnesium Chloride 64 MG TBEC Take by mouth.    [provider]  meloxicam (MOBIC) 15 MG tablet Take 15 mg by mouth daily. Patient taking differently: Take 15 mg by mouth daily. Pt taking prn    [provider]  metroNIDAZOLE (METROCREAM) 0.75 % cream Apply topically. 05/20/22   [provider]  Multiple Vitamins-Minerals (ZINC PO) Take 1 tablet by mouth daily.    [provider]  olopatadine (PATANOL) 0.1 % ophthalmic solution  12/23/16   [provider]  triamcinolone  cream (KENALOG ) 0.1 % Apply twice a day to rash as needed for itching. 05/27/23     triamcinolone  cream (KENALOG ) 0.1 % Apply to rash twice daily as needed for itch 12/23/23     VITAMIN D  PO Take 1 tablet by mouth daily.    [provider]   Takes vitamin D , calcium, magnesium, and probiotics   reports that she has quit smoking. She has never used smokeless tobacco. She reports current alcohol use of about 2.0 standard drinks of alcohol per week. She reports that she does not use drugs. Lives with husband and 1 dog Currently retired.  Tobacco- Denies use. EtOH- drank spirit occasionally. Last drink was Friday night. No withdrawal hx.  Illicit drug use- denies use.  IADLs/ADLs- can person independently at baseline    Physical Exam: Vitals:   04/15/24 1214 04/15/24 1415 04/15/24 1500  BP: 124/83  (!) 121/55  Pulse: 88 63 66  Resp: 16  17  Temp: 98.6 F (37 C)    TempSrc: Oral    SpO2: 100% 100% 100%  Weight: 55.3 kg    Height: 5' 5 (1.651 m)       Physical Exam General: NAD HENT: NCAT, dry MM Lungs: CTAB, no wheeze, rhonchi or rales.  Cardiovascular: Normal heart sounds, no r/m/g, 2+ pulses in all extremities. No LE edema Abdomen: TTP in mid lower and Left lower quadrant, normal bowel sounds MSK: No asymmetry or muscle atrophy.  Skin: no lesions noted on exposed skin Neuro:  Alert and oriented x4. CN grossly intact Psych: Normal mood and normal affect    Labs on Admission: I have personally reviewed following labs and imaging studies  CBC: Recent Labs  Lab 04/15/24 1241  WBC 11.7*  HGB 13.7  HCT 42.3  MCV 93.0  PLT 373   Basic Metabolic Panel: Recent Labs  Lab 04/15/24 1241  NA 137  K 3.8  CL 100  CO2 23  GLUCOSE 112*  BUN 12  CREATININE 0.69  CALCIUM 10.4*   GFR: Estimated  Creatinine Clearance: 61.2 mL/min (by C-G formula based on SCr of 0.69 mg/dL). Liver Function Tests: Recent Labs  Lab 04/15/24 1241  AST 31  ALT 33  ALKPHOS 84  BILITOT 0.7  PROT 7.9  ALBUMIN 4.5   Recent Labs  Lab 04/15/24 1241  LIPASE 29   No results for input(s): AMMONIA in the last 168 hours. Coagulation Profile: No results for input(s): INR, PROTIME in the last 168 hours. Cardiac Enzymes: No results for input(s): CKTOTAL, CKMB, CKMBINDEX, TROPONINI, TROPONINIHS in the last 168 hours. BNP (last 3 results) No results for input(s): BNP in the last 8760 hours. HbA1C: No results for input(s): HGBA1C in the last 72 hours. CBG: No results for input(s): GLUCAP in the last 168 hours. Lipid Profile: No results for input(s): CHOL, HDL, LDLCALC, TRIG, CHOLHDL, LDLDIRECT in the last 72 hours. Thyroid Function Tests: No results for input(s): TSH, T4TOTAL, FREET4, T3FREE, THYROIDAB in the last 72 hours. Anemia Panel: No results for input(s): VITAMINB12, FOLATE, FERRITIN, TIBC, IRON, RETICCTPCT in the last 72 hours. Urine analysis:    Component Value Date/Time   COLORURINE YELLOW 10/04/2017 2147   APPEARANCEUR HAZY (A) 10/04/2017 2147   LABSPEC 1.023 10/04/2017 2147   PHURINE 7.0 10/04/2017 2147   GLUCOSEU NEGATIVE 10/04/2017 2147   HGBUR LARGE (A) 10/04/2017 2147   BILIRUBINUR NEGATIVE 10/04/2017 2147   BILIRUBINUR n 02/21/2016 1128   KETONESUR 20 (A) 10/04/2017 2147   PROTEINUR 100 (A) 10/04/2017  2147   UROBILINOGEN negative 02/21/2016 1128   NITRITE NEGATIVE 10/04/2017 2147   LEUKOCYTESUR NEGATIVE 10/04/2017 2147    Radiological Exams on Admission: I have personally reviewed images CT ABDOMEN PELVIS W CONTRAST Result Date: 04/15/2024 EXAM: CT ABDOMEN AND PELVIS WITH CONTRAST 04/15/2024 02:49:26 PM TECHNIQUE: CT of the abdomen and pelvis was performed with the administration of 100 mL of iohexol (OMNIPAQUE) 300 MG/ML solution. Multiplanar reformatted images are provided for review. Automated exposure control, iterative reconstruction, and/or weight-based adjustment of the mA/kV was utilized to reduce the radiation dose to as low as reasonably achievable. COMPARISON: CT abdomen and pelvis 09/1998 and 25. CLINICAL HISTORY: Abd pain, being treated for diverticulitis, worsening pain. FINDINGS: LOWER CHEST: No acute abnormality. LIVER: The liver is unremarkable. GALLBLADDER AND BILE DUCTS: Common bile duct measures 9 mm. There is mild intrahepatic biliary ductal dilatation. No gallstones are seen. No pericholecystic inflammation. Gallbladder is unremarkable. SPLEEN: No acute abnormality. PANCREAS: No acute abnormality. ADRENAL GLANDS: No acute abnormality. KIDNEYS, URETERS AND BLADDER: No stones in the kidneys or ureters. No hydronephrosis. No perinephric or periureteral stranding. Urinary bladder is unremarkable. GI AND BOWEL: Stomach demonstrates no acute abnormality. Appendix appears normal. There is a moderate length segment of marked wall thickening of the mid and proximal colon. There is trace surrounding inflammation. There is some ill-defined hypodense enhancing areas within the wall of the sigmoid colon at this level particularly superiorly measuring up to 6.9 x 1.5 x 1.8 cm best seen on sagittal image 9/93. There are only a few scattered sigmoid colon diverticula. There is no bowel obstruction. PERITONEUM AND RETROPERITONEUM: No ascites. No free air. VASCULATURE: Aorta is normal in caliber.  LYMPH NODES: No lymphadenopathy. REPRODUCTIVE ORGANS: No acute abnormality. BONES AND SOFT TISSUES: No acute osseous abnormality. No focal soft tissue abnormality. IMPRESSION: 1. Wall thickening of the mid sigmoid colon with minimal surrounding inflammatory change and scattered diverticula. The location is similar to 09/2023. Findings may be related to focal colitis over diverticulitis. Neoplasm cannot be excluded given the appearance. 2.  Ill-defined enhancing hypodense areas within the sigmoid colon wall measuring up to 6.9 x 1.5 x 1.8 cm; findings may be related to abscess, but neoplasm is not excluded given minimal surrounding inflammation. 3. Mild intrahepatic biliary ductal dilatation with a 9 mm common bile duct. No cholelithiasis or cholecystitis. Correlate with lab values. This can be further evaluated with MRCP or ERCP. Electronically signed by: Greig Pique MD 04/15/2024 03:24 PM EDT RP Workstation: [id number]      Assessment/Plan Principal Problem:   Diverticulitis of intestine with abscess  Pt with left lower quadrant pain and symptoms of nausea and vomiting found to have acute diverticulitis. Previously known to have diverticulosis. CBC shows leukocytosis. ESR and CRP ordered. Pt with abscess formation on imaging. Colitis appears secondary to 14 episodes of diarrhea secondary to laxative use. Blood cultures ordered. Pt started on Zosyn. May need IR if pt has worsening clinical picture but pt is being followed by GI currently. Appreciate their recommendations.    VTE prophylaxis:  Lovenox  Diet: Code Status:  Full Code Telemetry:  Admission status: Inpatient, Med-Surg Patient is from: Home  Anticipated d/c is to: Home  Anticipated d/c is in: 3-4 days   Family Communication: Updated at bedside   Consults called: GI by EDP    Severity of Illness: The appropriate patient status for this patient is INPATIENT. Inpatient status is judged to be reasonable and necessary in order to  provide the required intensity of service to ensure the patient's safety. The patient's presenting symptoms, physical exam findings, and initial radiographic and laboratory data in the context of their chronic comorbidities is felt to place them at high risk for further clinical deterioration. Furthermore, it is not anticipated that the patient will be medically stable for discharge from the hospital within 2 midnights of admission.   * I certify that at the point of admission it is my clinical judgment that the patient will require inpatient hospital care spanning beyond 2 midnights from the point of admission due to high intensity of service, high risk for further deterioration and high frequency of surveillance required.DEWAINE Morene Bathe, MD [place] DEL. [facility]

## 2024-04-15 NOTE — Telephone Encounter (Signed)
 Inbound call from patients husband stating wife hasn't been able to keep anything down and has  been having diarrhea, cramping and tried taking antibiotic but ended up throwing it up as well.  Patient would like to speak to nurse and be advised on what to do next  Please advise  Thank you

## 2024-04-15 NOTE — ED Provider Notes (Signed)
 [facility] EMERGENCY DEPARTMENT AT [facility] Provider Note   CSN: [id number] Arrival date & time: 04/15/24  1200     Patient presents with: Abdominal Pain   Brenda Dyer is a 68 y.o. female.   68 year old female presents for evaluation of abdominal pain.  She has a history of diverticulitis is currently being treated for but pain has gotten worse.  States it is now all over her abdomen and radiates to her chest.  GI advised her to come to the ER.  States she has had vomiting and diarrhea and been unable to tolerate anything p.o. today.  Denies any other symptoms or concerns.   Abdominal Pain Associated symptoms: diarrhea and vomiting   Associated symptoms: no chest pain, no chills, no cough, no dysuria, no fever, no hematuria, no shortness of breath and no sore throat        Prior to Admission medications   Medication Sig Start Date End Date Taking? Authorizing Provider  amoxicillin-clavulanate (AUGMENTIN) 875-125 MG tablet Take 1 tablet by mouth 2 (two) times daily. 04/13/24   Nandigam, Kavitha V, MD  acamprosate (CAMPRAL) 333 MG tablet Take 666 mg by mouth 3 (three) times daily with meals. Patient not taking: Reported on 12/18/2023    [provider]  celecoxib  (CELEBREX ) 100 MG capsule Take 1 capsule (100 mg total) by mouth 2 (two) times daily. 09/17/22   Brooks, Dana, DO  cetirizine (ZYRTEC) 10 MG tablet Take by mouth.    [provider]  COVID-19 mRNA bivalent vaccine, Pfizer, (PFIZER COVID-19 VAC BIVALENT) injection Inject into the muscle. 05/25/21   Luiz Channel, MD  diclofenac  sodium (VOLTAREN ) 1 % GEL Apply 2 g topically 4 (four) times daily. 08/25/17   Jerri Kay HERO, MD  ibuprofen  (ADVIL ) 800 MG tablet Take 1 tablet (800 mg total) by mouth every 8 (eight) hours as needed. 02/29/20   Jerri Kay HERO, MD  influenza vac split quadrivalent PF (FLUARIX  QUADRIVALENT) 0.5 ML injection Inject into the muscle. 05/11/21     lactobacillus  acidophilus (BACID) TABS tablet Take 2 tablets by mouth 3 (three) times daily.    [provider]  Magnesium Chloride 64 MG TBEC Take by mouth.    [provider]  meloxicam (MOBIC) 15 MG tablet Take 15 mg by mouth daily. Patient taking differently: Take 15 mg by mouth daily. Pt taking prn    [provider]  metroNIDAZOLE (METROCREAM) 0.75 % cream Apply topically. 05/20/22   [provider]  Multiple Vitamins-Minerals (ZINC PO) Take 1 tablet by mouth daily.    [provider]  olopatadine (PATANOL) 0.1 % ophthalmic solution  12/23/16   [provider]  triamcinolone  cream (KENALOG ) 0.1 % Apply twice a day to rash as needed for itching. 05/27/23     triamcinolone  cream (KENALOG ) 0.1 % Apply to rash twice daily as needed for itch 12/23/23     VITAMIN D  PO Take 1 tablet by mouth daily.    [provider]    Allergies: Codeine and Linaclotide    Review of Systems  Constitutional:  Negative for chills and fever.  HENT:  Negative for ear pain and sore throat.   Eyes:  Negative for pain and visual disturbance.  Respiratory:  Negative for cough and shortness of breath.   Cardiovascular:  Negative for chest pain and palpitations.  Gastrointestinal:  Positive for abdominal pain, diarrhea and vomiting.  Genitourinary:  Negative for dysuria and hematuria.  Musculoskeletal:  Negative for arthralgias  and back pain.  Skin:  Negative for color change and rash.  Neurological:  Negative for seizures and syncope.  All other systems reviewed and are negative.   Updated Vital Signs BP 124/83 (BP Location: Right Arm)   Pulse 63   Temp 98.6 F (37 C) (Oral)   Resp 16   Ht 5' 5 (1.651 m)   Wt 55.3 kg   LMP 07/02/2007 (Approximate)   SpO2 100%   BMI 20.30 kg/m   Physical Exam Vitals and nursing note reviewed.  Constitutional:      General: She is not in acute distress.    Appearance: She is well-developed. She is not ill-appearing.   HENT:     Head: Normocephalic and atraumatic.  Eyes:     Conjunctiva/sclera: Conjunctivae normal.  Cardiovascular:     Rate and Rhythm: Normal rate and regular rhythm.     Heart sounds: No murmur heard. Pulmonary:     Effort: Pulmonary effort is normal. No respiratory distress.     Breath sounds: Normal breath sounds.  Abdominal:     Palpations: Abdomen is soft.     Tenderness: There is generalized abdominal tenderness.  Musculoskeletal:        General: No swelling.     Cervical back: Neck supple.  Skin:    General: Skin is warm and dry.     Capillary Refill: Capillary refill takes less than 2 seconds.  Neurological:     Mental Status: She is alert.  Psychiatric:        Mood and Affect: Mood normal.     (all labs ordered are listed, but only abnormal results are displayed) Labs Reviewed  COMPREHENSIVE METABOLIC PANEL WITH GFR - Abnormal; Notable for the following components:      Result Value   Glucose, Bld 112 (*)    Calcium 10.4 (*)    All other components within normal limits  CBC - Abnormal; Notable for the following components:   WBC 11.7 (*)    All other components within normal limits  LIPASE, BLOOD    EKG: None  Radiology: CT ABDOMEN PELVIS W CONTRAST Result Date: 04/15/2024 EXAM: CT ABDOMEN AND PELVIS WITH CONTRAST 04/15/2024 02:49:26 PM TECHNIQUE: CT of the abdomen and pelvis was performed with the administration of 100 mL of iohexol (OMNIPAQUE) 300 MG/ML solution. Multiplanar reformatted images are provided for review. Automated exposure control, iterative reconstruction, and/or weight-based adjustment of the mA/kV was utilized to reduce the radiation dose to as low as reasonably achievable. COMPARISON: CT abdomen and pelvis 09/1998 and 25. CLINICAL HISTORY: Abd pain, being treated for diverticulitis, worsening pain. FINDINGS: LOWER CHEST: No acute abnormality. LIVER: The liver is unremarkable. GALLBLADDER AND BILE DUCTS: Common bile duct measures 9 mm. There  is mild intrahepatic biliary ductal dilatation. No gallstones are seen. No pericholecystic inflammation. Gallbladder is unremarkable. SPLEEN: No acute abnormality. PANCREAS: No acute abnormality. ADRENAL GLANDS: No acute abnormality. KIDNEYS, URETERS AND BLADDER: No stones in the kidneys or ureters. No hydronephrosis. No perinephric or periureteral stranding. Urinary bladder is unremarkable. GI AND BOWEL: Stomach demonstrates no acute abnormality. Appendix appears normal. There is a moderate length segment of marked wall thickening of the mid and proximal colon. There is trace surrounding inflammation. There is some ill-defined hypodense enhancing areas within the wall of the sigmoid colon at this level particularly superiorly measuring up to 6.9 x 1.5 x 1.8 cm best seen on sagittal image 9/93. There are only a few scattered sigmoid colon diverticula. There is no  bowel obstruction. PERITONEUM AND RETROPERITONEUM: No ascites. No free air. VASCULATURE: Aorta is normal in caliber. LYMPH NODES: No lymphadenopathy. REPRODUCTIVE ORGANS: No acute abnormality. BONES AND SOFT TISSUES: No acute osseous abnormality. No focal soft tissue abnormality. IMPRESSION: 1. Wall thickening of the mid sigmoid colon with minimal surrounding inflammatory change and scattered diverticula. The location is similar to 09/2023. Findings may be related to focal colitis over diverticulitis. Neoplasm cannot be excluded given the appearance. 2. Ill-defined enhancing hypodense areas within the sigmoid colon wall measuring up to 6.9 x 1.5 x 1.8 cm; findings may be related to abscess, but neoplasm is not excluded given minimal surrounding inflammation. 3. Mild intrahepatic biliary ductal dilatation with a 9 mm common bile duct. No cholelithiasis or cholecystitis. Correlate with lab values. This can be further evaluated with MRCP or ERCP. Electronically signed by: Greig Pique MD 04/15/2024 03:24 PM EDT RP Workstation: [id number]     Procedures    Medications Ordered in the ED  ondansetron  (ZOFRAN ) injection 4 mg (4 mg Intravenous Given 04/15/24 1408)  HYDROmorphone  (DILAUDID ) injection 0.5 mg (0.5 mg Intravenous Given 04/15/24 1408)  lactated ringers bolus 1,000 mL (0 mLs Intravenous Stopped 04/15/24 1537)  iohexol (OMNIPAQUE) 300 MG/ML solution 100 mL (100 mLs Intravenous Contrast Given 04/15/24 1434)                                    Medical Decision Making Patient here for abdominal pain in the setting of diverticulitis, being treated as an outpatient with Augmentin.  She has had some vomiting and diarrhea today 2.  Vitals are stable.  Given fluids Zofran  and Dilaudid .  Pain somewhat improved.  Lab workup fairly unremarkable except for slight leukocytosis.  CT abdomen pelvis shows evidence of new soft tissue mass that is 2 x 6 cm which could be an abscess or neoplasm, she also still with diverticulitis and colitis.  Discussed with GI on-call for [place], they will see the patient and make further recommendations. Patient's case discussed with hospitalist and she will be admitted for further workup and management.   Problems Addressed: Abdominal mass, unspecified abdominal location: acute illness or injury Diverticulitis: acute illness or injury  Amount and/or Complexity of Data Reviewed External Data Reviewed: notes.    Details: Outpatient records reviewed and patient was advised to come to the ED by GI office today for worsening pain.  Labs: ordered. Decision-making details documented in ED Course.    Details: Ordered and reviewed by me and unremarkable  Radiology: ordered and independent interpretation performed. Decision-making details documented in ED Course.    Details: Ordered and reviewed by me and discussed with GI-patient with new soft tissue mass as well as worsening diverticulitis and colitis Discussion of management or test interpretation with external provider(s): [facility] -on-call provider, discussed with her  and she will evaluate the patient and make further recommendations but recommend admission to hospital service  Hospitalist-I spoke on the phone he will with the patient for further workup and management  Risk OTC drugs. Prescription drug management. Parenteral controlled substances. Drug therapy requiring intensive monitoring for toxicity. Decision regarding hospitalization.     Final diagnoses:  Diverticulitis  Abdominal mass, unspecified abdominal location    ED Discharge Orders     None          Gennaro Duwaine CROME, DO 04/15/24 1551

## 2024-04-15 NOTE — ED Notes (Signed)
 Patient transported to CT

## 2024-04-15 NOTE — Telephone Encounter (Signed)
 Please advise patient to come to ER given she is not able to tolerate much PO intake with worsening pain and not tolerating oral antibiotics

## 2024-04-15 NOTE — Consult Note (Addendum)
 Referring Provider: Duwaine Dr. Gennaro Primary Care Physician:  Cathlyn JAYSON Nikki Bobie FORBES, MD Primary Gastroenterologist:  Dr.Nandigam   Reason for Consultation:  N/V/D and abdominal pain  HPI: [facility] Brenda Dyer is a 68 y.o. female with a past medical history of arthritis, kidney stones, scoliosis, osteopenia/osteoporosis and diverticulitis.  Patient contacted Dr. Nandigam 04/13/2024 with concerns regarding constipation x 2 weeks and LLQ pain x  5 days which were similar to the symptoms she had in the past when diagnosed with diverticulitis.  She was prescribed Augmentin 875 mg twice daily for 10 days with plans to check an abdominal/pelvic CT if her pain persisted or worsened.  She contacted our office earlier today and endorsed having worsening abdominal pain with nausea, vomiting and 10 episodes of diarrhea since yesterday.  She was sent to the ED for further evaluation.  Labs in the ED showed a WBC count 11.7.  Hemoglobin 13.7.  Hematocrit 42.3.  Platelets 373.  Sodium 137.  Potassium 3.8.  Glucose 112.  BUN 12.  Creatinine 0.69.  Total bili 0.7.  Alk phos 84.  AST 31.  ALT 33.  Lipase 29.  CTAP with contrast showed wall thickening to the mid sigmoid colon with minimal surrounding inflammatory changes and scattered diverticula which is similar when compared to imaging 09/2023 and suggestive of focal colitis versus diverticulitis and an ill-defined enhancing hypodense area within the sigmoid colon wall measuring 6.9 x 1.5 x 1.8 cm concerning for an abscess and mild intrahepatic biliary ductal dilatation with a CBD measuring 9 mm without cholelithiasis or cholecystitis.   She initially noticed urinary incontinence 1 month ago and a change in bowel pattern about 2 weeks ago.  She described having multiple urgent mushy stools in the morning for the past few weeks then started passing pebble-like stool.  She developed significant LLQ abdominal pain on Saturday, 04/10/2024 which progressively worsened  with associated abdominal bloat and she contacted Dr. Shila 10/14 as noted above and was prescribed Augmentin 875 mg twice daily.  She took 2 doses of Augmentin on Tuesday, 2 doses on Wednesday and 1 dose this morning.  She awakened at 2 AM and passed 10-12 episodes of nonbloody brown diarrhea and vomited nonbloody yellow bilious fluid x 3.  She has taken Tylenol  every 6 hours for the past 2 days without improvement.  No NSAID use. She stated her first episode of diverticulitis was 09/2023.  CTAP 10/09/2023 showed evidence of subacute/smoldering diverticulitis in the proximal sigmoid colon without abscess which was treated the course of Augmentin and her abdominal pain abated.  Her most recent colonoscopy was 06/03/2023 which identified 1 hyperplastic polyp from the rectum and severe diverticulosis was present in the sigmoid and descending colon with narrowing of the colon in association with diverticular opening.  No known family history of colorectal cancer.  She is a non-smoker.  She drinks 2 shots of whiskey daily.  Her husband is at the bedside.  GI PROCEDURES:  Colonoscopy 06/03/2023: - One 3 mm polyp in the rectum, removed with a cold snare. Resected and retrieved. - Severe diverticulosis in the sigmoid colon and in the descending colon. There was narrowing of the colon in association with the diverticular opening. There was evidence of diverticular spasm. Peri-diverticular erythema was seen. There was evidence of an impacted diverticulum. - Non-bleeding external and internal hemorrhoids. 1. Surgical [P], colon, rectum, polyp (1) :       HYPERPLASTIC POLYP       NEGATIVE FOR DYSPLASIA AND  CARCINOMA   Past Medical History:  Diagnosis Date   Abnormal Pap smear of cervix 1997, 2006-2009   --1997 pt. had colpo/cryotherapy to cervix in [place], [place], 2006-2009 abn.paps(dysplasia) and colposcopies but no treatment to cervix--paps returned to normal   Arthritis    lumbarspine   Cataract    bilateral  (early)   Diverticulosis    Genital warts 1984   Glaucoma    Suspect  at this time   Kidney stones    Osteopenia    hips and legs   Osteoporosis    wrists only   Scoliosis    STD (sexually transmitted disease)    Hx condyloma    Past Surgical History:  Procedure Laterality Date   CESAREAN SECTION  1991   GYNECOLOGIC CRYOSURGERY  1997   done in [place], [place]   MANDIBLE SURGERY Bilateral 2015   PELVIC LAPAROSCOPY  1995   d/t back pain--nl--in [place], [place]    Prior to Admission medications   Medication Sig Start Date End Date Taking? Authorizing Provider  amoxicillin-clavulanate (AUGMENTIN) 875-125 MG tablet Take 1 tablet by mouth 2 (two) times daily. 04/13/24   Nandigam, Kavitha V, MD  acamprosate (CAMPRAL) 333 MG tablet Take 666 mg by mouth 3 (three) times daily with meals. Patient not taking: Reported on 12/18/2023    [provider]  celecoxib  (CELEBREX ) 100 MG capsule Take 1 capsule (100 mg total) by mouth 2 (two) times daily. 09/17/22   Brooks, Dana, DO  cetirizine (ZYRTEC) 10 MG tablet Take by mouth.    [provider]  COVID-19 mRNA bivalent vaccine, Pfizer, (PFIZER COVID-19 VAC BIVALENT) injection Inject into the muscle. 05/25/21   Luiz Channel, MD  diclofenac  sodium (VOLTAREN ) 1 % GEL Apply 2 g topically 4 (four) times daily. 08/25/17   Jerri Kay HERO, MD  ibuprofen  (ADVIL ) 800 MG tablet Take 1 tablet (800 mg total) by mouth every 8 (eight) hours as needed. 02/29/20   Jerri Kay HERO, MD  influenza vac split quadrivalent PF (FLUARIX  QUADRIVALENT) 0.5 ML injection Inject into the muscle. 05/11/21     lactobacillus acidophilus (BACID) TABS tablet Take 2 tablets by mouth 3 (three) times daily.    [provider]  Magnesium Chloride 64 MG TBEC Take by mouth.    [provider]  meloxicam (MOBIC) 15 MG tablet Take 15 mg by mouth daily. Patient taking differently: Take 15 mg by mouth daily. Pt taking prn    [provider]  metroNIDAZOLE  (METROCREAM) 0.75 % cream Apply topically. 05/20/22   [provider]  Multiple Vitamins-Minerals (ZINC PO) Take 1 tablet by mouth daily.    [provider]  olopatadine (PATANOL) 0.1 % ophthalmic solution  12/23/16   [provider]  triamcinolone  cream (KENALOG ) 0.1 % Apply twice a day to rash as needed for itching. 05/27/23     triamcinolone  cream (KENALOG ) 0.1 % Apply to rash twice daily as needed for itch 12/23/23     VITAMIN D  PO Take 1 tablet by mouth daily.    [provider]    No current facility-administered medications for this encounter.   Current Outpatient Medications  Medication Sig Dispense Refill   amoxicillin-clavulanate (AUGMENTIN) 875-125 MG tablet Take 1 tablet by mouth 2 (two) times daily. 20 tablet 0   acamprosate (CAMPRAL) 333 MG tablet Take 666 mg by mouth 3 (three) times daily with meals. (Patient not taking: Reported on 12/18/2023)     celecoxib  (CELEBREX ) 100 MG capsule Take 1 capsule (  100 mg total) by mouth 2 (two) times daily. 30 capsule 1   cetirizine (ZYRTEC) 10 MG tablet Take by mouth.     COVID-19 mRNA bivalent vaccine, Pfizer, (PFIZER COVID-19 VAC BIVALENT) injection Inject into the muscle. 0.3 mL 0   diclofenac  sodium (VOLTAREN ) 1 % GEL Apply 2 g topically 4 (four) times daily. 1 Tube 5   ibuprofen  (ADVIL ) 800 MG tablet Take 1 tablet (800 mg total) by mouth every 8 (eight) hours as needed. 30 tablet 2   influenza vac split quadrivalent PF (FLUARIX  QUADRIVALENT) 0.5 ML injection Inject into the muscle. 0.5 mL 0   lactobacillus acidophilus (BACID) TABS tablet Take 2 tablets by mouth 3 (three) times daily.     Magnesium Chloride 64 MG TBEC Take by mouth.     meloxicam (MOBIC) 15 MG tablet Take 15 mg by mouth daily. (Patient taking differently: Take 15 mg by mouth daily. Pt taking prn)     metroNIDAZOLE (METROCREAM) 0.75 % cream Apply topically.     Multiple Vitamins-Minerals (ZINC PO) Take 1 tablet by mouth daily.      olopatadine (PATANOL) 0.1 % ophthalmic solution      triamcinolone  cream (KENALOG ) 0.1 % Apply twice a day to rash as needed for itching. 15 g 0   triamcinolone  cream (KENALOG ) 0.1 % Apply to rash twice daily as needed for itch 80 g 0   VITAMIN D  PO Take 1 tablet by mouth daily.      Allergies as of 04/15/2024 - Review Complete 04/15/2024  Allergen Reaction Noted   Codeine Nausea Only 02/06/2015   Linaclotide Other (See Comments) 05/16/2023    Family History  Problem Relation Age of Onset   Colon polyps Mother    Colonic polyp Mother        11/2018   Stroke Mother    Heart attack Father    Diabetes Father    Other Father        vascular/cluster H/As   Stroke Father    Hyperlipidemia Brother    Breast cancer Neg Hx    Colon cancer Neg Hx    Esophageal cancer Neg Hx    Stomach cancer Neg Hx    Rectal cancer Neg Hx     Social History   Socioeconomic History   Marital status: Married    Spouse name: Not on file   Number of children: 1   Years of education: Not on file   Highest education level: Not on file  Occupational History   Occupation: retired  Tobacco Use   Smoking status: Former   Smokeless tobacco: Never  Vaping Use   Vaping status: Never Used  Substance and Sexual Activity   Alcohol use: Yes    Alcohol/week: 2.0 standard drinks of alcohol    Types: 2 Standard drinks or equivalent per week    Comment: occasionally   Drug use: No   Sexual activity: Not Currently    Partners: Male    Birth control/protection: Post-menopausal  Other Topics Concern   Not on file  Social History Narrative   Not on file   Social Drivers of Health   Financial Resource Strain: Not on file  Food Insecurity: Not on file  Transportation Needs: Not on file  Physical Activity: Not on file  Stress: Not on file  Social Connections: Unknown (11/12/2021)   Received from [facility]   Social Network    Social Network: Not on file  Intimate Partner Violence: Unknown (10/04/2021)  Received from [facility]   HITS    Physically Hurt: Not on file    Insult or Talk Down To: Not on file    Threaten Physical Harm: Not on file    Scream or Curse: Not on file   Review of Systems: Gen: Denies fever, sweats or chills. No weight loss.  CV: Denies chest pain, palpitations or edema. Resp: Denies cough, shortness of breath of hemoptysis.  GI: See HPI. No GERD symptoms.  GU: + Recent urinary incontinence. MS: Denies joint pain, muscles aches or weakness. Derm: Denies rash, itchiness, skin lesions or unhealing ulcers. Psych: Denies depression, anxiety, memory loss or confusion. Heme: Denies easy bruising, bleeding. Neuro:  Denies headaches, dizziness or paresthesias. Endo:  Denies any problems with DM, thyroid or adrenal function.  Physical Exam: Vital signs in last 24 hours: Temp:  [98.6 F (37 C)] 98.6 F (37 C) (10/16 1214) Pulse Rate:  [63-88] 63 (10/16 1415) Resp:  [16] 16 (10/16 1214) BP: (124)/(83) 124/83 (10/16 1214) SpO2:  [100 %] 100 % (10/16 1415) Weight:  [55.3 kg] 55.3 kg (10/16 1214)   General: Alert 68 year old female with suntanned complexion, mild facial flushing and no acute distress. Head:  Normocephalic and atraumatic. Eyes:  No scleral icterus. Conjunctiva pink. Ears:  Normal auditory acuity. Nose:  No deformity, discharge or lesions. Mouth:  Dentition intact. No ulcers or lesions.  Neck:  Supple. No lymphadenopathy or thyromegaly.  Lungs: Breath sounds clear throughout. No wheezes, rhonchi or crackles.  Heart: Regular rate and rhythm, no murmurs. Abdomen: Soft, nondistended.  Moderate tenderness throughout the lower abdomen > LLQ without rebound or guarding.  Positive bowel sounds to all 4 quadrants. Musculoskeletal:  Symmetrical without gross deformities.  Pulses:  Normal pulses noted. Extremities:  Without clubbing or edema. Neurologic:  Alert and  oriented x 4. No focal deficits.  Skin:  Intact without significant lesions or  rashes. Psych:  Alert and cooperative. Normal mood and affect.  Intake/Output from previous day: No intake/output data recorded. Intake/Output this shift: Total I/O In: 1000 [IV Piggyback:1000] Out: -   Lab Results: Recent Labs    04/15/24 1241  WBC 11.7*  HGB 13.7  HCT 42.3  PLT 373   BMET Recent Labs    04/15/24 1241  NA 137  K 3.8  CL 100  CO2 23  GLUCOSE 112*  BUN 12  CREATININE 0.69  CALCIUM 10.4*   LFT Recent Labs    04/15/24 1241  PROT 7.9  ALBUMIN 4.5  AST 31  ALT 33  ALKPHOS 84  BILITOT 0.7     Studies/Results: CT ABDOMEN PELVIS W CONTRAST Result Date: 04/15/2024 EXAM: CT ABDOMEN AND PELVIS WITH CONTRAST 04/15/2024 02:49:26 PM TECHNIQUE: CT of the abdomen and pelvis was performed with the administration of 100 mL of iohexol (OMNIPAQUE) 300 MG/ML solution. Multiplanar reformatted images are provided for review. Automated exposure control, iterative reconstruction, and/or weight-based adjustment of the mA/kV was utilized to reduce the radiation dose to as low as reasonably achievable. COMPARISON: CT abdomen and pelvis 09/1998 and 25. CLINICAL HISTORY: Abd pain, being treated for diverticulitis, worsening pain. FINDINGS: LOWER CHEST: No acute abnormality. LIVER: The liver is unremarkable. GALLBLADDER AND BILE DUCTS: Common bile duct measures 9 mm. There is mild intrahepatic biliary ductal dilatation. No gallstones are seen. No pericholecystic inflammation. Gallbladder is unremarkable. SPLEEN: No acute abnormality. PANCREAS: No acute abnormality. ADRENAL GLANDS: No acute abnormality. KIDNEYS, URETERS AND BLADDER: No stones in the kidneys or ureters. No hydronephrosis. No  perinephric or periureteral stranding. Urinary bladder is unremarkable. GI AND BOWEL: Stomach demonstrates no acute abnormality. Appendix appears normal. There is a moderate length segment of marked wall thickening of the mid and proximal colon. There is trace surrounding inflammation. There is  some ill-defined hypodense enhancing areas within the wall of the sigmoid colon at this level particularly superiorly measuring up to 6.9 x 1.5 x 1.8 cm best seen on sagittal image 9/93. There are only a few scattered sigmoid colon diverticula. There is no bowel obstruction. PERITONEUM AND RETROPERITONEUM: No ascites. No free air. VASCULATURE: Aorta is normal in caliber. LYMPH NODES: No lymphadenopathy. REPRODUCTIVE ORGANS: No acute abnormality. BONES AND SOFT TISSUES: No acute osseous abnormality. No focal soft tissue abnormality. IMPRESSION: 1. Wall thickening of the mid sigmoid colon with minimal surrounding inflammatory change and scattered diverticula. The location is similar to 09/2023. Findings may be related to focal colitis over diverticulitis. Neoplasm cannot be excluded given the appearance. 2. Ill-defined enhancing hypodense areas within the sigmoid colon wall measuring up to 6.9 x 1.5 x 1.8 cm; findings may be related to abscess, but neoplasm is not excluded given minimal surrounding inflammation. 3. Mild intrahepatic biliary ductal dilatation with a 9 mm common bile duct. No cholelithiasis or cholecystitis. Correlate with lab values. This can be further evaluated with MRCP or ERCP. Electronically signed by: Greig Pique MD 04/15/2024 03:24 PM EDT RP Workstation: [id number]    IMPRESSION/PLAN:  68 year old female with a history of sigmoid diverticulitis 09/2023 presents with lower abdominal pain, more pronounced to the LLQ which has progressively worsened over the past 5 days.  WBC 11.7. CTAP with contrast showed wall thickening to the mid sigmoid colon with minimal surrounding inflammatory changes suggestive of focal colitis versus diverticulitis and an ill-defined enhancing hypodense area within the sigmoid colon wall measuring 6.9 x 1.5 x 1.8 cm concerning for an abscess.  Less suspicion for malignant process as patient underwent a colonoscopy 06/2023 which identified diverticulosis to the left  colon and one hyperplastic polyp was removed from the rectum. - Clear liquid diet - Pain management and IV fluids per the hospitalist - Zosyn 3.375 g IV every 8 hours - Await further recommendations per Dr. Avram  N/V, secondary to acute illness as noted above - Ondansetron  4 mg p.o. or IV every 6 hours as needed  Diarrhea, likely triggered by MiraLAX and Augmentin.  Less likely colitis in setting of diverticulitis with an abscess. - Continue to monitor  CTAP showed mild intrahepatic bili ductal dilatation, CBD measured 9 mm without evidence of gallstones.  Normal LFTs.   Elida CHRISTELLA Shawl  04/15/2024, 4:38 PM     [facility] Attending   I have seen the patient in conjunction with the advanced practitioner we have taken a history, reviewed the chart and examined the patient.  I have performed the majority the medical decision making.  I agree with the Advanced Practitioner's note, impression and recommendations with the following additions:  This patient has acute diverticulitis second episode is complicated with changes of an abscess.  Colonoscopy in December 2024 without mass lesions in the sigmoid colon so I do not think this represents neoplasm.  She will be treated with IV Zosyn and antiemetics.  I think she should be considered for surgical consultation, which could be an outpatient appointment unless she fails to respond here, to explore the possibility of elective segmental resection.  Outpatient appointment is likely preferable as she could meet with one of the colorectal surgeons who does  most of these surgeries.   We will follow.  Lupita CHARLENA Commander, MD, [facility] See TRACEY on call - gastroenterology for best contact person 04/15/2024 5:03 PM

## 2024-04-15 NOTE — Telephone Encounter (Signed)
 Spoke with pt. She reports that since 10/14 when she called the first time, she feels like her symptoms have gotten worse. She reports minimal appetite. She has eaten a banana this morning and that is all. Since speaking with the nurse Tuesday, she has taken in total 3 capfuls of Miralax. Last dose at 1900 yesterday, and since 0200 she has had 10 episodes of diarrhea. She has been taking the Amoxicillin as scheduled. This morning within 1 hour of taking ABT, she started dry heaving and then vomiting yellow bile. Denies fevers, however, she is taking Tylenol  Q6H around the clock. Pt verbalized, I just don't know how I am going to get through this.

## 2024-04-16 DIAGNOSIS — R197 Diarrhea, unspecified: Secondary | ICD-10-CM | POA: Diagnosis not present

## 2024-04-16 DIAGNOSIS — K5720 Diverticulitis of large intestine with perforation and abscess without bleeding: Secondary | ICD-10-CM | POA: Diagnosis not present

## 2024-04-16 DIAGNOSIS — Z860102 Personal history of hyperplastic colon polyps: Secondary | ICD-10-CM

## 2024-04-16 LAB — BASIC METABOLIC PANEL WITH GFR
Anion gap: 11 (ref 5–15)
BUN: 8 mg/dL (ref 8–23)
CO2: 26 mmol/L (ref 22–32)
Calcium: 9.6 mg/dL (ref 8.9–10.3)
Chloride: 102 mmol/L (ref 98–111)
Creatinine, Ser: 0.84 mg/dL (ref 0.44–1.00)
GFR, Estimated: >60 mL/min (ref >60)
Glucose, Bld: 102 mg/dL — ABNORMAL HIGH (ref 70–99)
Potassium: 4.3 mmol/L (ref 3.5–5.1)
Sodium: 139 mmol/L (ref 135–145)

## 2024-04-16 LAB — CBC
HCT: 38.1 % (ref 36.0–46.0)
Hemoglobin: 12 g/dL (ref 12.0–15.0)
MCH: 30.1 pg (ref 26.0–34.0)
MCHC: 31.5 g/dL (ref 30.0–36.0)
MCV: 95.5 fL (ref 80.0–100.0)
Platelets: 329 K/uL (ref 150–400)
RBC: 3.99 MIL/uL (ref 3.87–5.11)
RDW: 12.2 % (ref 11.5–15.5)
WBC: 7.4 K/uL (ref 4.0–10.5)
nRBC: 0 % (ref 0.0–0.2)

## 2024-04-16 NOTE — Hospital Course (Signed)
 Brenda Dyer is a 68 y.o. year old female without significant past medical history presenting t hospital with worsening abdominal pain.  Denies any fever chills but had loose bowel movements.  In the ED patient was hemodynamically stable.  Afebrile.  Labs were notable for mild leukocytosis at 11.7 with mild hypercalcemia.  Lipase was 29.  CRP and ESR elevated.  HIV was nonreactive.  Imaging showed evidence of sigmoid diverticulitis/colitis with 6.9 x 1.5 x 1.8 lesion in the sigmoid colon concerning for abscess versus neoplasm.  Case was discussed with GI and patient was considered for admission to the hospital for further evaluation and treatment.   Sigmoid diverticulitis with abscess Second episode of diverticulitis.  Had mild leukocytosis on presentation with elevated inflammatory markers.  Has been having some diarrhea.  Follow-up blood cultures.  Has been empirically started on Zosyn.SABRA  GI following.  Patient did have a history of a colonoscopy in December 2024 with mass lesion in the sigmoid colon.  Currently on clear liquids.  If not improved will likely need in house surgical consultation otherwise plan for outpatient surgical consultation.

## 2024-04-16 NOTE — Plan of Care (Signed)
  Problem: Clinical Measurements: Goal: Ability to maintain clinical measurements within normal limits will improve Outcome: Progressing Goal: Diagnostic test results will improve Outcome: Progressing   Problem: Activity: Goal: Risk for activity intolerance will decrease Outcome: Progressing   Problem: Elimination: Goal: Will not experience complications related to urinary retention Outcome: Progressing   Problem: Pain Managment: Goal: General experience of comfort will improve and/or be controlled Outcome: Progressing   Problem: Safety: Goal: Ability to remain free from injury will improve Outcome: Progressing

## 2024-04-16 NOTE — Progress Notes (Addendum)
 [facility] Progress Note  CC:  N/V/D, diverticulitis with abscess   Subjective: She is feeling much better today.  Her lower abdominal pain has significantly improved.  She passed 2 watery diarrhea bowel movements overnight.  She is tolerating a clear liquid diet and does not wish to advance her diet at this time.  No nausea or vomiting.  She endorses having a headache at this time.   Objective:  Vital signs in last 24 hours: Temp:  [98.3 F (36.8 C)-99.2 F (37.3 C)] 98.8 F (37.1 C) (10/17 0514) Pulse Rate:  [63-108] 108 (10/17 0514) Resp:  [11-17] 15 (10/17 0514) BP: (101-124)/(55-83) 111/67 (10/17 0514) SpO2:  [93 %-100 %] 93 % (10/17 0514) Weight:  [55.3 kg] 55.3 kg (10/16 1214) Last BM Date : 04/15/24 General: Alert 68 year old female in no acute distress. Heart: Regular rate and rhythm, no murmurs. Pulm: Breath sounds clear throughout.  On room air. Abdomen: Soft, nondistended.  Mild tenderness to the central lower abdomen and left lower quadrant without rebound or guarding.  Positive bowel sounds to all 4 quadrants.  No palpable mass. Extremities: No lower extremity edema. Neurologic:  Alert and oriented x 4. Grossly normal neurologically. Psych:  Alert and cooperative. Normal mood and affect.  Intake/Output from previous day: 10/16 0701 - 10/17 0700 In: 1388.1 [P.O.:340; IV Piggyback:1048.1] Out: -   Lab Results: Recent Labs    04/15/24 1241 04/16/24 0452  WBC 11.7* 7.4  HGB 13.7 12.0  HCT 42.3 38.1  PLT 373 329   BMET Recent Labs    04/15/24 1241 04/16/24 0452  NA 137 139  K 3.8 4.3  CL 100 102  CO2 23 26  GLUCOSE 112* 102*  BUN 12 8  CREATININE 0.69 0.84  CALCIUM 10.4* 9.6   LFT Recent Labs    04/15/24 1241  PROT 7.9  ALBUMIN 4.5  AST 31  ALT 33  ALKPHOS 84  BILITOT 0.7    CT ABDOMEN PELVIS W CONTRAST Result Date: 04/15/2024 EXAM: CT ABDOMEN AND PELVIS WITH CONTRAST 04/15/2024 02:49:26 PM TECHNIQUE: CT of the  abdomen and pelvis was performed with the administration of 100 mL of iohexol (OMNIPAQUE) 300 MG/ML solution. Multiplanar reformatted images are provided for review. Automated exposure control, iterative reconstruction, and/or weight-based adjustment of the mA/kV was utilized to reduce the radiation dose to as low as reasonably achievable. COMPARISON: CT abdomen and pelvis 09/1998 and 25. CLINICAL HISTORY: Abd pain, being treated for diverticulitis, worsening pain. FINDINGS: LOWER CHEST: No acute abnormality. LIVER: The liver is unremarkable. GALLBLADDER AND BILE DUCTS: Common bile duct measures 9 mm. There is mild intrahepatic biliary ductal dilatation. No gallstones are seen. No pericholecystic inflammation. Gallbladder is unremarkable. SPLEEN: No acute abnormality. PANCREAS: No acute abnormality. ADRENAL GLANDS: No acute abnormality. KIDNEYS, URETERS AND BLADDER: No stones in the kidneys or ureters. No hydronephrosis. No perinephric or periureteral stranding. Urinary bladder is unremarkable. GI AND BOWEL: Stomach demonstrates no acute abnormality. Appendix appears normal. There is a moderate length segment of marked wall thickening of the mid and proximal colon. There is trace surrounding inflammation. There is some ill-defined hypodense enhancing areas within the wall of the sigmoid colon at this level particularly superiorly measuring up to 6.9 x 1.5 x 1.8 cm best seen on sagittal image 9/93. There are only a few scattered sigmoid colon diverticula. There is no bowel obstruction. PERITONEUM AND RETROPERITONEUM: No ascites. No free air. VASCULATURE: Aorta is normal in caliber. LYMPH NODES: No lymphadenopathy. REPRODUCTIVE ORGANS:  No acute abnormality. BONES AND SOFT TISSUES: No acute osseous abnormality. No focal soft tissue abnormality. IMPRESSION: 1. Wall thickening of the mid sigmoid colon with minimal surrounding inflammatory change and scattered diverticula. The location is similar to 09/2023. Findings may  be related to focal colitis over diverticulitis. Neoplasm cannot be excluded given the appearance. 2. Ill-defined enhancing hypodense areas within the sigmoid colon wall measuring up to 6.9 x 1.5 x 1.8 cm; findings may be related to abscess, but neoplasm is not excluded given minimal surrounding inflammation. 3. Mild intrahepatic biliary ductal dilatation with a 9 mm common bile duct. No cholelithiasis or cholecystitis. Correlate with lab values. This can be further evaluated with MRCP or ERCP. Electronically signed by: Greig Pique MD 04/15/2024 03:24 PM EDT RP Workstation: [id number]   Patient Profile: Brenda Dyer is a 68 y.o. female with a past medical history of arthritis, kidney stones, scoliosis, osteopenia/osteoporosis and diverticulitis. Admitted 04/15/2024 with N/V/D and lower abdominal pain.   Assessment / Plan:  68 year old female with a history of sigmoid diverticulitis 09/2023 admitted with lower abdominal pain, more pronounced to the LLQ which progressively worsened x 5 days. WBC 11.7 -> 7.4.  Sed rate 35.  CRP 15.2.  CTAP with contrast showed wall thickening to the mid sigmoid colon with minimal surrounding inflammatory changes suggestive of focal colitis versus diverticulitis and an ill-defined enhancing hypodense area within the sigmoid colon wall measuring 6.9 x 1.5 x 1.8 cm concerning for an abscess.  Less suspicion for malignant process as patient underwent a recent colonoscopy 06/2023 which identified diverticulosis to the left colon and one hyperplastic polyp was removed from the rectum.  Abdominal pain has significantly improved overnight.  Afebrile.  Mildly tachycardic earlier this morning, stable BP. - Clear liquid diet - Pain management and IV fluids per the hospitalist - Zosyn 3.375 g IV every 8 hours - Abdominal pain significantly improved at this time, however, if she develops worsening abdominal pain would consider repeat CTAP and possible IR consult to drain  abscess - Eventual general surgery consult, likely as an outpatient - CBC, BMP in a.m. - Await further recommendations per Dr. Avram   N/V, secondary to acute illness as noted above - Ondansetron  4 mg p.o. or IV every 6 hours as needed   Diarrhea, likely triggered by MiraLAX and Augmentin.  - Continue to monitor - Consider C. difficile quick screen and GI pathogen panel if diarrhea persist during this hospitalization  Headache - Tylenol  as needed - Further management per the hospitalist   CTAP showed mild intrahepatic bili ductal dilatation, CBD measured 9 mm without evidence of gallstones.  Normal LFTs.     LOS: 1 day   Elida CHRISTELLA Shawl  04/16/2024, 9:25 AM    [facility] Attending   I have taken an interval history, reviewed the chart and examined the patient.  I have performed the majority of the medical decision making which is of moderate complexity.  I agree with the Advanced Practitioner's note, impression and recommendations with the following additions:   She is significantly better and less tender. Diarrhea is abating.  I would have a high threshold to do stool studies.  I suspect diarrhea was mostly due to MiraLAX.  I reviewed this with her, she has had Augmentin before and it did not cause diarrhea and I am likely to use that as outpatient management.  Suspect she will need at least 72 hours hospitalization with IV antibiotics.  I am advancing to a full liquid  diet and advance as tolerated to soft.  I will follow-up tomorrow.   Lupita CHARLENA Commander, MD, [facility] See TRACEY on call - gastroenterology for best contact person 04/16/2024 11:28 AM

## 2024-04-16 NOTE — Progress Notes (Signed)
 PROGRESS NOTE  Rella Egelston FMW:[id number] DOB: Aug 14, 1955 DOA: 04/15/2024 PCP: Cathlyn JAYSON Nikki Bobie FORBES, MD   LOS: 1 day   Brief narrative:  Brenda Dyer is a 68 y.o. year old female without significant past medical history presented to the hospital with worsening abdominal pain.  Denied any fever chills but had loose bowel movements.  In the ED patient was hemodynamically stable.  Afebrile.  Labs were notable for mild leukocytosis at 11.7 with mild hypercalcemia.  Lipase was 29.  CRP and ESR elevated.  HIV was nonreactive.  Imaging showed evidence of sigmoid diverticulitis/colitis with 6.9 x 1.5 x 1.8 lesion in the sigmoid colon concerning for abscess versus neoplasm.  Case was discussed with GI and patient was considered for admission to the hospital for further evaluation and treatment.   Assessment/Plan: Principal Problem:   Abscess of sigmoid colon due to diverticulitis  Sigmoid diverticulitis with abscess Second episode of diverticulitis.  Had mild leukocytosis on presentation with elevated inflammatory markers.    Follow-up blood cultures.  Has been empirically started on IV Zosyn.SABRA  GI following.  Patient did have a history of a colonoscopy in December 2024.  Currently on clear liquids.  If not improved will likely need repeat scanning, in-house surgical consultation otherwise plan for outpatient surgical consultation.   DVT prophylaxis: enoxaparin (LOVENOX) injection 40 mg Start: 04/15/24 2200   Disposition: Home likely in 1 to 2 days  Status is: Inpatient Remains inpatient appropriate because: IV antibiotic, pending clinical status    Code Status:     Code Status: Full Code  Family Communication: None  Consultants: GI  Procedures: None  Anti-infectives:  Zosyn  Anti-infectives (From admission, onward)    Start     Dose/Rate Route Frequency Ordered Stop   04/16/24 0100  piperacillin-tazobactam (ZOSYN) IVPB 3.375 g        3.375 g 12.5 mL/hr  over 240 Minutes Intravenous Every 8 hours 04/15/24 1603     04/15/24 1615  piperacillin-tazobactam (ZOSYN) IVPB 3.375 g        3.375 g 100 mL/hr over 30 Minutes Intravenous  Once 04/15/24 1603 04/15/24 1750        Subjective: Today, patient was seen and examined at bedside.  Patient stated that she feels better today in terms of energy and has decreased pain in the abdomen.  Denies any nausea vomiting fever chills or rigor.  Had loose bowel movement overnight.  Has some headache.  Objective: Vitals:   04/16/24 0514 04/16/24 0939  BP: 111/67 121/69  Pulse: (!) 108 60  Resp: 15 18  Temp: 98.8 F (37.1 C) 98.3 F (36.8 C)  SpO2: 93% 100%    Intake/Output Summary (Last 24 hours) at 04/16/2024 1344 Last data filed at 04/16/2024 1000 Gross per 24 hour  Intake 1868.08 ml  Output 300 ml  Net 1568.08 ml   Filed Weights   04/15/24 1214  Weight: 55.3 kg   Body mass index is 20.3 kg/m.   Physical Exam: GENERAL: Patient is alert awake and oriented. Not in obvious distress. HENT: No scleral pallor or icterus. Pupils equally reactive to light. Oral mucosa is moist NECK: is supple, no gross swelling noted. CHEST: Clear to auscultation. No crackles or wheezes.  Diminished breath sounds bilaterally. CVS: S1 and S2 heard, no murmur. Regular rate and rhythm.  ABDOMEN: Soft, mild tenderness on deep palpation of the left lower quadrant., bowel sounds are present. EXTREMITIES: No edema. CNS: Cranial nerves are intact. No focal motor  deficits. SKIN: warm and dry without rashes.  Data Review: I have personally reviewed the following laboratory data and studies,  CBC: Recent Labs  Lab 04/15/24 1241 04/16/24 0452  WBC 11.7* 7.4  HGB 13.7 12.0  HCT 42.3 38.1  MCV 93.0 95.5  PLT 373 329   Basic Metabolic Panel: Recent Labs  Lab 04/15/24 1241 04/16/24 0452  NA 137 139  K 3.8 4.3  CL 100 102  CO2 23 26  GLUCOSE 112* 102*  BUN 12 8  CREATININE 0.69 0.84  CALCIUM 10.4* 9.6    Liver Function Tests: Recent Labs  Lab 04/15/24 1241  AST 31  ALT 33  ALKPHOS 84  BILITOT 0.7  PROT 7.9  ALBUMIN 4.5   Recent Labs  Lab 04/15/24 1241  LIPASE 29   No results for input(s): AMMONIA in the last 168 hours. Cardiac Enzymes: No results for input(s): CKTOTAL, CKMB, CKMBINDEX, TROPONINI in the last 168 hours. BNP (last 3 results) No results for input(s): BNP in the last 8760 hours.  ProBNP (last 3 results) No results for input(s): PROBNP in the last 8760 hours.  CBG: No results for input(s): GLUCAP in the last 168 hours. No results found for this or any previous visit (from the past 240 hours).   Studies: CT ABDOMEN PELVIS W CONTRAST Result Date: 04/15/2024 EXAM: CT ABDOMEN AND PELVIS WITH CONTRAST 04/15/2024 02:49:26 PM TECHNIQUE: CT of the abdomen and pelvis was performed with the administration of 100 mL of iohexol (OMNIPAQUE) 300 MG/ML solution. Multiplanar reformatted images are provided for review. Automated exposure control, iterative reconstruction, and/or weight-based adjustment of the mA/kV was utilized to reduce the radiation dose to as low as reasonably achievable. COMPARISON: CT abdomen and pelvis 09/1998 and 25. CLINICAL HISTORY: Abd pain, being treated for diverticulitis, worsening pain. FINDINGS: LOWER CHEST: No acute abnormality. LIVER: The liver is unremarkable. GALLBLADDER AND BILE DUCTS: Common bile duct measures 9 mm. There is mild intrahepatic biliary ductal dilatation. No gallstones are seen. No pericholecystic inflammation. Gallbladder is unremarkable. SPLEEN: No acute abnormality. PANCREAS: No acute abnormality. ADRENAL GLANDS: No acute abnormality. KIDNEYS, URETERS AND BLADDER: No stones in the kidneys or ureters. No hydronephrosis. No perinephric or periureteral stranding. Urinary bladder is unremarkable. GI AND BOWEL: Stomach demonstrates no acute abnormality. Appendix appears normal. There is a moderate length segment of marked  wall thickening of the mid and proximal colon. There is trace surrounding inflammation. There is some ill-defined hypodense enhancing areas within the wall of the sigmoid colon at this level particularly superiorly measuring up to 6.9 x 1.5 x 1.8 cm best seen on sagittal image 9/93. There are only a few scattered sigmoid colon diverticula. There is no bowel obstruction. PERITONEUM AND RETROPERITONEUM: No ascites. No free air. VASCULATURE: Aorta is normal in caliber. LYMPH NODES: No lymphadenopathy. REPRODUCTIVE ORGANS: No acute abnormality. BONES AND SOFT TISSUES: No acute osseous abnormality. No focal soft tissue abnormality. IMPRESSION: 1. Wall thickening of the mid sigmoid colon with minimal surrounding inflammatory change and scattered diverticula. The location is similar to 09/2023. Findings may be related to focal colitis over diverticulitis. Neoplasm cannot be excluded given the appearance. 2. Ill-defined enhancing hypodense areas within the sigmoid colon wall measuring up to 6.9 x 1.5 x 1.8 cm; findings may be related to abscess, but neoplasm is not excluded given minimal surrounding inflammation. 3. Mild intrahepatic biliary ductal dilatation with a 9 mm common bile duct. No cholelithiasis or cholecystitis. Correlate with lab values. This can be further evaluated with MRCP  or ERCP. Electronically signed by: Greig Pique MD 04/15/2024 03:24 PM EDT RP Workstation: [id number]      Vernal Alstrom, MD  [facility] 04/16/2024  If 7PM-7AM, please contact night-coverage

## 2024-04-16 NOTE — TOC Initial Note (Signed)
 Transition of Care [facility]) - Initial/Assessment Note    Patient Details  Name: Brenda Dyer MRN: [id number] Date of Birth: 1956/01/15  [facility]) CM/SW Contact:    Sonda Manuella Quill, RN Phone Number: 04/16/2024, 5:40 PM  Clinical Narrative:                 [place] w/ pt in room; pt said she lives at home w/ her spouse Brenda Dyer ([phone]); she plans to return at d/c; he will provide transportation; pt verified insurance/PCP; she denied SDOH risks; pt does not have DME, HH services, or home oxygen; IP CM will follow.  Expected Discharge Plan: Home/Self Care Barriers to Discharge: Continued Medical Work up   Patient Goals and CMS Choice Patient states their goals for this hospitalization and ongoing recovery are:: home          Expected Discharge Plan and Services   Discharge Planning Services: CM Consult   Living arrangements for the past 2 months: Single Family Home                 DME Arranged: N/A DME Agency: NA       HH Arranged: NA HH Agency: NA        Prior Living Arrangements/Services Living arrangements for the past 2 months: Single Family Home Lives with:: Spouse Patient language and need for interpreter reviewed:: Yes Do you feel safe going back to the place where you live?: Yes      Need for Family Participation in Patient Care: Yes (Comment) Care giver support system in place?: Yes (comment) Current home services:  (n/a) Criminal Activity/Legal Involvement Pertinent to Current Situation/Hospitalization: No - Comment as needed  Activities of Daily Living   ADL Screening (condition at time of admission) Independently performs ADLs?: Yes (appropriate for developmental age) Is the patient deaf or have difficulty hearing?: No Does the patient have difficulty seeing, even when wearing glasses/contacts?: No Does the patient have difficulty concentrating, remembering, or making decisions?: No  Permission  Sought/Granted Permission sought to share information with : Case Manager Permission granted to share information with : Yes, Verbal Permission Granted  Share Information with NAME: Case Manager     Permission granted to share info w Relationship: Brenda Dyer (spouse) [phone]     Emotional Assessment Appearance:: Appears stated age Attitude/Demeanor/Rapport: Gracious Affect (typically observed): Accepting Orientation: : Oriented to Self, Oriented to Place, Oriented to  Time, Oriented to Situation Alcohol / Substance Use: Not Applicable Psych Involvement: No (comment)  Admission diagnosis:  Diverticulitis [K57.92] Acute diverticulitis [K57.92] Diverticulitis of intestine with abscess [K57.80] Abdominal mass, unspecified abdominal location [R19.00] Patient Active Problem List   Diagnosis Date Noted   Abscess of sigmoid colon due to diverticulitis 04/15/2024   Enthesopathy of ankle and tarsus 07/12/2010   ABNORMALITY OF GAIT 07/12/2010   CAVUS DEFORMITY OF FOOT, ACQUIRED 07/21/2008   UNEQUAL LEG LENGTH 07/21/2008   SHIN SPLINTS 07/21/2008   PCP:  Cathlyn JAYSON Nikki Bobie FORBES, MD Pharmacy:   [facility] [id number] - [place], [place] - [address] [address] Phone: [phone] Fax: [phone]     Social Drivers of Health (SDOH) Social History: SDOH Screenings   Food Insecurity: No Food Insecurity (04/16/2024)  Housing: Low Risk  (04/16/2024)  Transportation Needs: No Transportation Needs (04/16/2024)  Utilities: Not At Risk (04/16/2024)  Social Connections: Moderately Integrated (04/15/2024)  Tobacco Use: Medium Risk (04/15/2024)   SDOH Interventions: Food Insecurity Interventions: Intervention Not Indicated,  Inpatient TOC Housing Interventions: Intervention Not Indicated, Inpatient TOC Transportation Interventions: Intervention Not Indicated, Inpatient TOC Utilities Interventions: Intervention Not Indicated,  Inpatient TOC   Readmission Risk Interventions     No data to display

## 2024-04-17 DIAGNOSIS — R112 Nausea with vomiting, unspecified: Secondary | ICD-10-CM | POA: Diagnosis not present

## 2024-04-17 DIAGNOSIS — R197 Diarrhea, unspecified: Secondary | ICD-10-CM | POA: Diagnosis not present

## 2024-04-17 DIAGNOSIS — K5720 Diverticulitis of large intestine with perforation and abscess without bleeding: Secondary | ICD-10-CM | POA: Diagnosis not present

## 2024-04-17 LAB — CBC
HCT: 37.5 % (ref 36.0–46.0)
Hemoglobin: 12.3 g/dL (ref 12.0–15.0)
MCH: 31.1 pg (ref 26.0–34.0)
MCHC: 32.8 g/dL (ref 30.0–36.0)
MCV: 94.9 fL (ref 80.0–100.0)
Platelets: 341 K/uL (ref 150–400)
RBC: 3.95 MIL/uL (ref 3.87–5.11)
RDW: 12 % (ref 11.5–15.5)
WBC: 3.2 K/uL — ABNORMAL LOW (ref 4.0–10.5)
nRBC: 0 % (ref 0.0–0.2)

## 2024-04-17 LAB — BASIC METABOLIC PANEL WITH GFR
Anion gap: 13 (ref 5–15)
BUN: 7 mg/dL — ABNORMAL LOW (ref 8–23)
CO2: 24 mmol/L (ref 22–32)
Calcium: 9.6 mg/dL (ref 8.9–10.3)
Chloride: 104 mmol/L (ref 98–111)
Creatinine, Ser: 0.71 mg/dL (ref 0.44–1.00)
GFR, Estimated: >60 mL/min (ref >60)
Glucose, Bld: 102 mg/dL — ABNORMAL HIGH (ref 70–99)
Potassium: 3.7 mmol/L (ref 3.5–5.1)
Sodium: 141 mmol/L (ref 135–145)

## 2024-04-17 LAB — MAGNESIUM: Magnesium: 2.4 mg/dL (ref 1.7–2.4)

## 2024-04-17 MED ORDER — CARMEX CLASSIC LIP BALM EX OINT
1.0000 | TOPICAL_OINTMENT | CUTANEOUS | Status: DC | PRN
  Administered 2024-04-17: 1 via TOPICAL
  Filled 2024-04-17: qty 10

## 2024-04-17 NOTE — Plan of Care (Signed)
   Problem: Nutrition: Goal: Adequate nutrition will be maintained Outcome: Progressing

## 2024-04-17 NOTE — Progress Notes (Signed)
 PROGRESS NOTE  Brenda Dyer FMW:[id number] DOB: [birth year] DOA: 04/15/2024 PCP: Cathlyn JAYSON Nikki Bobie FORBES, MD   LOS: 2 days   Brief narrative:  Brenda Dyer is a 68 y.o. year old female without significant past medical history presented to the hospital with worsening abdominal pain.  Denied any fever chills but had loose bowel movements.  In the ED patient was hemodynamically stable.  Afebrile.  Labs were notable for mild leukocytosis at 11.7 with mild hypercalcemia.  Lipase was 29.  CRP and ESR elevated.  HIV was nonreactive.  Imaging showed evidence of sigmoid diverticulitis/colitis with 6.9 x 1.5 x 1.8 lesion in the sigmoid colon concerning for abscess versus neoplasm.  Case further discussed with GI, they recommended admission for further management.  Has been receiving IV antibiotics with good response.  Assessment/Plan: Principal Problem:   Abscess of sigmoid colon due to diverticulitis  Sigmoid diverticulitis with abscess Second episode of diverticulitis.  Had mild leukocytosis on presentation with elevated inflammatory markers.    Follow-up blood cultures.  Has been empirically started on IV Zosyn.SABRA  GI following.  Patient did have a history of a colonoscopy in December 2024.  Currently on clear liquids.  Improving on IV antibiotics, GI recommending 72 hours of IV antibiotics. Will need to follow closely outpatient after discharge  DVT prophylaxis: enoxaparin (LOVENOX) injection 40 mg Start: 04/15/24 2200   Disposition: Home likely in 1 to 2 days  Status is: Inpatient Remains inpatient appropriate because: IV antibiotic, pending clinical status    Code Status:     Code Status: Full Code  Family Communication: None  Consultants: GI  Procedures: None  Anti-infectives:  Zosyn  Anti-infectives (From admission, onward)    Start     Dose/Rate Route Frequency Ordered Stop   04/16/24 0100  piperacillin-tazobactam (ZOSYN) IVPB 3.375 g        3.375  g 12.5 mL/hr over 240 Minutes Intravenous Every 8 hours 04/15/24 1603     04/15/24 1615  piperacillin-tazobactam (ZOSYN) IVPB 3.375 g        3.375 g 100 mL/hr over 30 Minutes Intravenous  Once 04/15/24 1603 04/15/24 1750        Subjective: Today, patient was seen and examined at bedside.  Patient continues to feel better.  Abdominal pain improving.  She is tolerating diet better.  No nausea today. Objective: Vitals:   04/16/24 2042 04/17/24 0538  BP: 121/67 115/61  Pulse: 66 (!) 59  Resp: 15 15  Temp: 98.7 F (37.1 C) 98 F (36.7 C)  SpO2: 98% 100%    Intake/Output Summary (Last 24 hours) at 04/17/2024 0756 Last data filed at 04/17/2024 9386 Gross per 24 hour  Intake 1554.93 ml  Output 1500 ml  Net 54.93 ml   Filed Weights   04/15/24 1214  Weight: 55.3 kg   Body mass index is 20.3 kg/m.   Physical Exam: GENERAL: Patient is alert awake and oriented. Not in obvious distress. HENT: No scleral pallor or icterus. Pupils equally reactive to light. Oral mucosa is moist NECK: is supple, no gross swelling noted. CHEST: Clear to auscultation. No crackles or wheezes.  Diminished breath sounds bilaterally. CVS: S1 and S2 heard, no murmur. Regular rate and rhythm.  ABDOMEN: Soft, mild tenderness on deep palpation of the left lower quadrant., bowel sounds are present. EXTREMITIES: No edema. CNS: Cranial nerves are intact. No focal motor deficits. SKIN: warm and dry without rashes.  Data Review: I have personally reviewed the following laboratory data  and studies,  CBC: Recent Labs  Lab 04/15/24 1241 04/16/24 0452 04/17/24 0532  WBC 11.7* 7.4 3.2*  HGB 13.7 12.0 12.3  HCT 42.3 38.1 37.5  MCV 93.0 95.5 94.9  PLT 373 329 341   Basic Metabolic Panel: Recent Labs  Lab 04/15/24 1241 04/16/24 0452 04/17/24 0532  NA 137 139 141  K 3.8 4.3 3.7  CL 100 102 104  CO2 23 26 24   GLUCOSE 112* 102* 102*  BUN 12 8 7*  CREATININE 0.69 0.84 0.71  CALCIUM 10.4* 9.6 9.6  MG   --   --  2.4   Liver Function Tests: Recent Labs  Lab 04/15/24 1241  AST 31  ALT 33  ALKPHOS 84  BILITOT 0.7  PROT 7.9  ALBUMIN 4.5   Recent Labs  Lab 04/15/24 1241  LIPASE 29   No results for input(s): AMMONIA in the last 168 hours. Cardiac Enzymes: No results for input(s): CKTOTAL, CKMB, CKMBINDEX, TROPONINI in the last 168 hours. BNP (last 3 results) No results for input(s): BNP in the last 8760 hours.  ProBNP (last 3 results) No results for input(s): PROBNP in the last 8760 hours.  CBG: No results for input(s): GLUCAP in the last 168 hours. No results found for this or any previous visit (from the past 240 hours).   Studies: CT ABDOMEN PELVIS W CONTRAST Result Date: 04/15/2024 EXAM: CT ABDOMEN AND PELVIS WITH CONTRAST 04/15/2024 02:49:26 PM TECHNIQUE: CT of the abdomen and pelvis was performed with the administration of 100 mL of iohexol (OMNIPAQUE) 300 MG/ML solution. Multiplanar reformatted images are provided for review. Automated exposure control, iterative reconstruction, and/or weight-based adjustment of the mA/kV was utilized to reduce the radiation dose to as low as reasonably achievable. COMPARISON: CT abdomen and pelvis 09/1998 and 25. CLINICAL HISTORY: Abd pain, being treated for diverticulitis, worsening pain. FINDINGS: LOWER CHEST: No acute abnormality. LIVER: The liver is unremarkable. GALLBLADDER AND BILE DUCTS: Common bile duct measures 9 mm. There is mild intrahepatic biliary ductal dilatation. No gallstones are seen. No pericholecystic inflammation. Gallbladder is unremarkable. SPLEEN: No acute abnormality. PANCREAS: No acute abnormality. ADRENAL GLANDS: No acute abnormality. KIDNEYS, URETERS AND BLADDER: No stones in the kidneys or ureters. No hydronephrosis. No perinephric or periureteral stranding. Urinary bladder is unremarkable. GI AND BOWEL: Stomach demonstrates no acute abnormality. Appendix appears normal. There is a moderate length  segment of marked wall thickening of the mid and proximal colon. There is trace surrounding inflammation. There is some ill-defined hypodense enhancing areas within the wall of the sigmoid colon at this level particularly superiorly measuring up to 6.9 x 1.5 x 1.8 cm best seen on sagittal image 9/93. There are only a few scattered sigmoid colon diverticula. There is no bowel obstruction. PERITONEUM AND RETROPERITONEUM: No ascites. No free air. VASCULATURE: Aorta is normal in caliber. LYMPH NODES: No lymphadenopathy. REPRODUCTIVE ORGANS: No acute abnormality. BONES AND SOFT TISSUES: No acute osseous abnormality. No focal soft tissue abnormality. IMPRESSION: 1. Wall thickening of the mid sigmoid colon with minimal surrounding inflammatory change and scattered diverticula. The location is similar to 09/2023. Findings may be related to focal colitis over diverticulitis. Neoplasm cannot be excluded given the appearance. 2. Ill-defined enhancing hypodense areas within the sigmoid colon wall measuring up to 6.9 x 1.5 x 1.8 cm; findings may be related to abscess, but neoplasm is not excluded given minimal surrounding inflammation. 3. Mild intrahepatic biliary ductal dilatation with a 9 mm common bile duct. No cholelithiasis or cholecystitis. Correlate with lab  values. This can be further evaluated with MRCP or ERCP. Electronically signed by: Greig Pique MD 04/15/2024 03:24 PM EDT RP Workstation: [id number]      Derryl Duval, MD  [facility] 04/17/2024  If 7PM-7AM, please contact night-coverage

## 2024-04-17 NOTE — Plan of Care (Signed)
 ?  Problem: Clinical Measurements: ?Goal: Will remain free from infection ?Outcome: Progressing ?  ?Problem: Clinical Measurements: ?Goal: Diagnostic test results will improve ?Outcome: Progressing ?  ?

## 2024-04-17 NOTE — Progress Notes (Signed)
   Patient Name: Maebelle Sulton Date of Encounter: 04/17/2024, 9:26 AM     Assessment and Plan  Sigmoid diverticulitis with abscess  Diarrhea, nausea and vomiting  Mild biliary tree dilation without symptoms or LFT abnormalities  ---------------------------------------------------------------------------------------------------------------  Significantly improved since admission. Continue Zosyn Consider discharge tomorrow    Subjective  Feeling much better.  She has been out of bed.  She has had a few small softer loose stools but diarrhea is better.  She feels like something is bulging and moving in her epigastrium at times.   Objective  BP 115/61 (BP Location: Left Arm)   Pulse (!) 59   Temp 98 F (36.7 C) (Oral)   Resp 15   Ht 5' 5 (1.651 m)   Wt 55.3 kg   LMP 07/02/2007 (Approximate)   SpO2 100%   BMI 20.30 kg/m  No acute distress Abdomen soft much less tender in the lower quadrants, bowel sounds present.  There is no hernia or obvious abnormality in the epigastrium.  There may be a small very soft umbilical hernia.  Recent Labs  Lab 04/15/24 1241 04/16/24 0452 04/17/24 0532  NA 137 139 141  K 3.8 4.3 3.7  CL 100 102 104  CO2 23 26 24   GLUCOSE 112* 102* 102*  BUN 12 8 7*  CREATININE 0.69 0.84 0.71  CALCIUM 10.4* 9.6 9.6  MG  --   --  2.4   Recent Labs  Lab 04/15/24 1241 04/16/24 0452 04/17/24 0532  HGB 13.7 12.0 12.3  HCT 42.3 38.1 37.5  WBC 11.7* 7.4 3.2*  PLT 373 329 341     CT abdomen pelvis with contrast 04/15/2024 IMPRESSION: 1. Wall thickening of the mid sigmoid colon with minimal surrounding inflammatory change and scattered diverticula. The location is similar to 09/2023. Findings may be related to focal colitis over diverticulitis. Neoplasm cannot be excluded given the appearance. 2. Ill-defined enhancing hypodense areas within the sigmoid colon wall measuring up to 6.9 x 1.5 x 1.8 cm; findings may be related to abscess,  but neoplasm is not excluded given minimal surrounding inflammation. 3. Mild intrahepatic biliary ductal dilatation with a 9 mm common bile duct. No cholelithiasis or cholecystitis. Correlate with lab values. This can be further evaluated with MRCP or ERCP.    Lupita CHARLENA Commander, MD, [facility] See TRACEY on call - gastroenterology for best contact person 04/17/2024 9:26 AM

## 2024-04-18 DIAGNOSIS — K5720 Diverticulitis of large intestine with perforation and abscess without bleeding: Secondary | ICD-10-CM | POA: Diagnosis not present

## 2024-04-18 MED ORDER — AMOXICILLIN-POT CLAVULANATE 875-125 MG PO TABS: 1.0000 | ORAL_TABLET | Freq: Three times a day (TID) | ORAL | 0 refills | Status: DC

## 2024-04-18 NOTE — Discharge Summary (Signed)
 Physician Discharge Summary  Brenda Dyer FMW:[id number] DOB: Jun 25, 1956 DOA: 04/15/2024  PCP: Cathlyn JAYSON Nikki Bobie FORBES, MD  Admit date: 04/15/2024 Discharge date: 04/18/2024  Admitted From: Home Disposition: Home  Recommendations for Outpatient Follow-up:  Follow up with GI as previously scheduled Call GI or Follow up in ED if symptoms worsen or new appear   Home Health: No Equipment/Devices: None  Discharge Condition: Stable CODE STATUS: Full Diet recommendation: Heart healthy  Brief/Interim Summary:  Brenda Dyer is a 68 y.o. year old female without significant past medical history presented to the hospital with worsening abdominal pain.  Denied any fever chills but had loose bowel movements.  In the ED patient was hemodynamically stable.  Afebrile.  Labs were notable for mild leukocytosis at 11.7 with mild hypercalcemia.  Lipase was 29.  CRP and ESR elevated.  HIV was nonreactive.  Imaging showed evidence of sigmoid diverticulitis/colitis with 6.9 x 1.5 x 1.8 lesion in the sigmoid colon concerning for abscess versus neoplasm.  Case further discussed with GI, they recommended admission for further management.  Patient was treated with IV Zosyn with good response.  After about 72 hours of IV antibiotics abdominal pain almost completely resolved.  She is up and ambulatory.  Tolerating diet well.  She is discharged home to continue Augmentin as an outpatient.  She took couple of doses of Augmentin prior to admission.  She is recommended to take Augmentin 3 times a day instead of twice a day for 10 days. She has outpatient appointment with GI already.     Discharge Diagnoses:  Principal Problem:   Abscess of sigmoid colon due to diverticulitis    Discharge Instructions  Discharge Instructions     Call MD for:  persistant nausea and vomiting   Complete by: As directed    Call MD for:  severe uncontrolled pain   Complete by: As directed    Diet - low sodium  heart healthy   Complete by: As directed    Discharge instructions   Complete by: As directed    1. Please complete 10 days of Augmentin. Recommend TID. Prescription sent 2. Follow up with PCP, GI   Increase activity slowly   Complete by: As directed       Allergies as of 04/18/2024       Reactions   Codeine Nausea Only   Linaclotide Swelling, Other (See Comments)   Leg swelling        Medication List     TAKE these medications    amoxicillin-clavulanate 875-125 MG tablet Commonly known as: AUGMENTIN Take 1 tablet by mouth in the morning, at noon, and at bedtime. What changed: when to take this   CALCIUM PO Take 1 tablet by mouth daily.   celecoxib  100 MG capsule Commonly known as: CeleBREX  Take 1 capsule (100 mg total) by mouth 2 (two) times daily.   cetirizine 10 MG tablet Commonly known as: ZYRTEC Take 10 mg by mouth daily as needed for allergies or rhinitis.   diclofenac  sodium 1 % Gel Commonly known as: VOLTAREN  Apply 2 g topically 4 (four) times daily. What changed:  when to take this reasons to take this   Fluarix  Quadrivalent 0.5 ML injection Generic drug: influenza vac split quadrivalent PF Inject into the muscle.   Ibuprofen  200 MG Caps Take 200 mg by mouth every 6 (six) hours as needed (for pain or headaches).   ibuprofen  800 MG tablet Commonly known as: ADVIL  Take 1 tablet (800 mg  total) by mouth every 8 (eight) hours as needed.   lactobacillus acidophilus Tabs tablet Take 1 tablet by mouth daily.   Magnesium Chloride 64 MG Tbec Take 1 tablet by mouth daily.   metroNIDAZOLE 0.75 % cream Commonly known as: METROCREAM Apply 1 Application topically 2 (two) times daily as needed (for rosacea flares).   Pfizer COVID-19 Vac Bivalent injection Generic drug: COVID-19 mRNA bivalent vaccine Proofreader) Inject into the muscle.   triamcinolone  cream 0.1 % Commonly known as: KENALOG  Apply twice a day to rash as needed for itching. What changed:  Another medication with the same name was changed. Make sure you understand how and when to take each.   triamcinolone  cream 0.1 % Commonly known as: KENALOG  Apply to rash twice daily as needed for itch What changed:  how much to take how to take this when to take this reasons to take this additional instructions   VITAMIN D3 PO Take 1 capsule by mouth daily.        Allergies  Allergen Reactions   Codeine Nausea Only   Linaclotide Swelling and Other (See Comments)    Leg swelling    Consultations:    Procedures/Studies: CT ABDOMEN PELVIS W CONTRAST Result Date: 04/15/2024 EXAM: CT ABDOMEN AND PELVIS WITH CONTRAST 04/15/2024 02:49:26 PM TECHNIQUE: CT of the abdomen and pelvis was performed with the administration of 100 mL of iohexol (OMNIPAQUE) 300 MG/ML solution. Multiplanar reformatted images are provided for review. Automated exposure control, iterative reconstruction, and/or weight-based adjustment of the mA/kV was utilized to reduce the radiation dose to as low as reasonably achievable. COMPARISON: CT abdomen and pelvis 09/1998 and 25. CLINICAL HISTORY: Abd pain, being treated for diverticulitis, worsening pain. FINDINGS: LOWER CHEST: No acute abnormality. LIVER: The liver is unremarkable. GALLBLADDER AND BILE DUCTS: Common bile duct measures 9 mm. There is mild intrahepatic biliary ductal dilatation. No gallstones are seen. No pericholecystic inflammation. Gallbladder is unremarkable. SPLEEN: No acute abnormality. PANCREAS: No acute abnormality. ADRENAL GLANDS: No acute abnormality. KIDNEYS, URETERS AND BLADDER: No stones in the kidneys or ureters. No hydronephrosis. No perinephric or periureteral stranding. Urinary bladder is unremarkable. GI AND BOWEL: Stomach demonstrates no acute abnormality. Appendix appears normal. There is a moderate length segment of marked wall thickening of the mid and proximal colon. There is trace surrounding inflammation. There is some ill-defined  hypodense enhancing areas within the wall of the sigmoid colon at this level particularly superiorly measuring up to 6.9 x 1.5 x 1.8 cm best seen on sagittal image 9/93. There are only a few scattered sigmoid colon diverticula. There is no bowel obstruction. PERITONEUM AND RETROPERITONEUM: No ascites. No free air. VASCULATURE: Aorta is normal in caliber. LYMPH NODES: No lymphadenopathy. REPRODUCTIVE ORGANS: No acute abnormality. BONES AND SOFT TISSUES: No acute osseous abnormality. No focal soft tissue abnormality. IMPRESSION: 1. Wall thickening of the mid sigmoid colon with minimal surrounding inflammatory change and scattered diverticula. The location is similar to 09/2023. Findings may be related to focal colitis over diverticulitis. Neoplasm cannot be excluded given the appearance. 2. Ill-defined enhancing hypodense areas within the sigmoid colon wall measuring up to 6.9 x 1.5 x 1.8 cm; findings may be related to abscess, but neoplasm is not excluded given minimal surrounding inflammation. 3. Mild intrahepatic biliary ductal dilatation with a 9 mm common bile duct. No cholelithiasis or cholecystitis. Correlate with lab values. This can be further evaluated with MRCP or ERCP. Electronically signed by: Greig Pique MD 04/15/2024 03:24 PM EDT RP Workstation: [id number]  Subjective:   Discharge Exam: Vitals:   04/17/24 2047 04/18/24 0527  BP: 125/62 (!) 98/56  Pulse: 62 69  Resp: 17 16  Temp: 98.7 F (37.1 C) 97.8 F (36.6 C)  SpO2: 100% 100%    General: Pt is alert, awake, not in acute distress Cardiovascular: rate controlled, S1/S2 + Respiratory: bilateral decreased breath sounds at bases Abdominal: Soft, NT, ND, bowel sounds + Extremities: no edema, no cyanosis    The results of significant diagnostics from this hospitalization (including imaging, microbiology, ancillary and laboratory) are listed below for reference.     Microbiology: No results found for this or any previous  visit (from the past 240 hours).   Labs: BNP (last 3 results) No results for input(s): BNP in the last 8760 hours. Basic Metabolic Panel: Recent Labs  Lab 04/15/24 1241 04/16/24 0452 04/17/24 0532  NA 137 139 141  K 3.8 4.3 3.7  CL 100 102 104  CO2 23 26 24   GLUCOSE 112* 102* 102*  BUN 12 8 7*  CREATININE 0.69 0.84 0.71  CALCIUM 10.4* 9.6 9.6  MG  --   --  2.4   Liver Function Tests: Recent Labs  Lab 04/15/24 1241  AST 31  ALT 33  ALKPHOS 84  BILITOT 0.7  PROT 7.9  ALBUMIN 4.5   Recent Labs  Lab 04/15/24 1241  LIPASE 29   No results for input(s): AMMONIA in the last 168 hours. CBC: Recent Labs  Lab 04/15/24 1241 04/16/24 0452 04/17/24 0532  WBC 11.7* 7.4 3.2*  HGB 13.7 12.0 12.3  HCT 42.3 38.1 37.5  MCV 93.0 95.5 94.9  PLT 373 329 341   Cardiac Enzymes: No results for input(s): CKTOTAL, CKMB, CKMBINDEX, TROPONINI in the last 168 hours. BNP: Invalid input(s): POCBNP CBG: No results for input(s): GLUCAP in the last 168 hours. D-Dimer No results for input(s): DDIMER in the last 72 hours. Hgb A1c No results for input(s): HGBA1C in the last 72 hours. Lipid Profile No results for input(s): CHOL, HDL, LDLCALC, TRIG, CHOLHDL, LDLDIRECT in the last 72 hours. Thyroid function studies No results for input(s): TSH, T4TOTAL, T3FREE, THYROIDAB in the last 72 hours.  Invalid input(s): FREET3 Anemia work up No results for input(s): VITAMINB12, FOLATE, FERRITIN, TIBC, IRON, RETICCTPCT in the last 72 hours. Urinalysis    Component Value Date/Time   COLORURINE YELLOW 10/04/2017 2147   APPEARANCEUR HAZY (A) 10/04/2017 2147   LABSPEC 1.023 10/04/2017 2147   PHURINE 7.0 10/04/2017 2147   GLUCOSEU NEGATIVE 10/04/2017 2147   HGBUR LARGE (A) 10/04/2017 2147   BILIRUBINUR NEGATIVE 10/04/2017 2147   BILIRUBINUR n 02/21/2016 1128   KETONESUR 20 (A) 10/04/2017 2147   PROTEINUR 100 (A) 10/04/2017 2147    UROBILINOGEN negative 02/21/2016 1128   NITRITE NEGATIVE 10/04/2017 2147   LEUKOCYTESUR NEGATIVE 10/04/2017 2147   Sepsis Labs Recent Labs  Lab 04/15/24 1241 04/16/24 0452 04/17/24 0532  WBC 11.7* 7.4 3.2*   Microbiology No results found for this or any previous visit (from the past 240 hours).   Time coordinating discharge: 35 minutes  SIGNED:   Rita Prom, MD  [facility] 04/18/2024, 2:19 PM

## 2024-04-18 NOTE — Plan of Care (Signed)
   Problem: Education: Goal: Knowledge of General Education information will improve Description: Including pain rating scale, medication(s)/side effects and non-pharmacologic comfort measures Outcome: Progressing   Problem: Activity: Goal: Risk for activity intolerance will decrease Outcome: Progressing

## 2024-04-18 NOTE — TOC Transition Note (Signed)
 Transition of Care [facility]) - Discharge Note   Patient Details  Name: Brenda Dyer MRN: [id number] Date of Birth: [birth year]  [facility] of [facility]) CM/SW Contact:  Sonda Manuella Quill, RN Phone Number: 04/18/2024, 1:26 PM   Clinical Narrative:    D/C orders received; no IP CM needs.   Final next level of care: Home/Self Care Barriers to Discharge: No Barriers Identified   Patient Goals and CMS Choice Patient states their goals for this hospitalization and ongoing recovery are:: home          Discharge Placement                       Discharge Plan and Services Additional resources added to the After Visit Summary for     Discharge Planning Services: CM Consult            DME Arranged: N/A DME Agency: NA       HH Arranged: NA HH Agency: NA        Social Drivers of Health (SDOH) Interventions SDOH Screenings   Food Insecurity: No Food Insecurity (04/16/2024)  Housing: Low Risk  (04/16/2024)  Transportation Needs: No Transportation Needs (04/16/2024)  Utilities: Not At Risk (04/16/2024)  Social Connections: Moderately Integrated (04/15/2024)  Tobacco Use: Medium Risk (04/15/2024)     Readmission Risk Interventions     No data to display

## 2024-04-18 NOTE — Progress Notes (Signed)
 Discharge instructions given to patient and all questions were answered.

## 2024-04-18 NOTE — Plan of Care (Signed)
   Problem: Education: Goal: Knowledge of General Education information will improve Description Including pain rating scale, medication(s)/side effects and non-pharmacologic comfort measures Outcome: Progressing   Problem: Health Behavior/Discharge Planning: Goal: Ability to manage health-related needs will improve Outcome: Progressing

## 2024-04-18 NOTE — Progress Notes (Signed)
   Patient Name: Brenda Dyer Mar Date of Encounter: 04/18/2024, 11:29 AM     Assessment and Plan  Sigmoid diverticulitis with abscess per CT though she has improved so fast ? Accuracy of this finding)  Diarrhea, nausea and vomiting - resolved  Mild biliary tree dilation without symptoms or LFT abnormalities  ---------------------------------------------------------------------------------------------------------------  Significantly improved since admission.  DC to home - she has Augmentin 875 mg bid at home already and is to resume  She may take her usual 1 tsp MiraLAx daily Soft diet  She has appt Dr. Shila 10/23       Subjective  Feeling much better.  No diarrhea, small stool yesterday none today  Objective  BP (!) 98/56 (BP Location: Right Arm)   Pulse 69   Temp 97.8 F (36.6 C)   Resp 16   Ht 5' 5 (1.651 m)   Wt 55.3 kg   LMP 07/02/2007 (Approximate)   SpO2 100%   BMI 20.30 kg/m  No acute distress Abdomen soft and completely nontender bowel sounds present.   Recent Labs  Lab 04/15/24 1241 04/16/24 0452 04/17/24 0532  NA 137 139 141  K 3.8 4.3 3.7  CL 100 102 104  CO2 23 26 24   GLUCOSE 112* 102* 102*  BUN 12 8 7*  CREATININE 0.69 0.84 0.71  CALCIUM 10.4* 9.6 9.6  MG  --   --  2.4   Recent Labs  Lab 04/15/24 1241 04/16/24 0452 04/17/24 0532  HGB 13.7 12.0 12.3  HCT 42.3 38.1 37.5  WBC 11.7* 7.4 3.2*  PLT 373 329 341     CT abdomen pelvis with contrast 04/15/2024 IMPRESSION: 1. Wall thickening of the mid sigmoid colon with minimal surrounding inflammatory change and scattered diverticula. The location is similar to 09/2023. Findings may be related to focal colitis over diverticulitis. Neoplasm cannot be excluded given the appearance. 2. Ill-defined enhancing hypodense areas within the sigmoid colon wall measuring up to 6.9 x 1.5 x 1.8 cm; findings may be related to abscess, but neoplasm is not excluded given minimal  surrounding inflammation. 3. Mild intrahepatic biliary ductal dilatation with a 9 mm common bile duct. No cholelithiasis or cholecystitis. Correlate with lab values. This can be further evaluated with MRCP or ERCP.    Brenda CHARLENA Commander, MD, [facility] See TRACEY on call - gastroenterology for best contact person 04/18/2024 11:29 AM

## 2024-04-19 ENCOUNTER — Telehealth: Payer: Self-pay

## 2024-04-19 MED ORDER — ONDANSETRON 4 MG PO TBDP
4.0000 mg | ORAL_TABLET | Freq: Three times a day (TID) | ORAL | 0 refills | Status: DC
Start: 2024-04-19 — End: 2024-05-03

## 2024-04-19 NOTE — Telephone Encounter (Signed)
 Pt called clinic. She was d/c'd from [facility] on 10/19 with diverticulitis. Was prescribed Augmentin TID x 10 days. She reports that about an hour after taking abt, she develops severe nausea. She has tried multiple things to decrease this including, taking meds with food, sucking on peppermint. She reports that none of these have worked. She would like to know if she could have anti-nausea medication while she is on the abt?

## 2024-04-19 NOTE — Telephone Encounter (Signed)
 Spoke with pt. Discussed provider recommendations to take Zofran  ODT 30 minutes prior to taking her abt. Pt agrees with this plan. Verified that pt has taken 1 dose of the abt and she is taking it TID for 10 days. Pharmacy verified. Rx sent.

## 2024-04-22 ENCOUNTER — Encounter: Payer: Self-pay | Admitting: Gastroenterology

## 2024-04-22 ENCOUNTER — Ambulatory Visit: Admitting: Gastroenterology

## 2024-04-22 VITALS — BP 108/64 | HR 87 | Ht 65.0 in | Wt 120.0 lb

## 2024-04-22 DIAGNOSIS — R634 Abnormal weight loss: Secondary | ICD-10-CM | POA: Diagnosis not present

## 2024-04-22 DIAGNOSIS — K838 Other specified diseases of biliary tract: Secondary | ICD-10-CM

## 2024-04-22 DIAGNOSIS — K5720 Diverticulitis of large intestine with perforation and abscess without bleeding: Secondary | ICD-10-CM

## 2024-04-22 DIAGNOSIS — R197 Diarrhea, unspecified: Secondary | ICD-10-CM

## 2024-04-22 DIAGNOSIS — R110 Nausea: Secondary | ICD-10-CM

## 2024-04-22 NOTE — Patient Instructions (Addendum)
 VISIT SUMMARY:  You came in for a follow-up on your diverticulitis and diverticular abscess. We discussed your recent symptoms, including nausea, vomiting, and weight loss, and reviewed your current treatment plan. We also addressed your upcoming international trip and how to manage your condition during travel.  YOUR PLAN:  SIGMOID DIVERTICULITIS WITH ABSCESS: You have a recurrent episode of diverticulitis with a 6 cm abscess, which is more severe than your previous episodes. -Continue taking Augmentin three times a day for 10 days. -We will do a follow-up CT scan early next week to check if the abscess has resolved. -You will be referred to colorectal surgeon Dr. Bernarda Ned for a consultation. -We will provide extra antibiotics for your upcoming international trip. -Consider elective surgery to remove the affected area of your sigmoid colon.  DIVERTICULOSIS OF COLON: You have chronic diverticulosis with a thickened colon wall. Dietary management is important to prevent complications. -Make dietary modifications including eating cooked vegetables, fruits, and whole grains. -Continue using Miralax as needed to maintain regular bowel movements.  ENLARGED COMMON BILE DUCT: Your common bile duct is slightly larger than expected for your age, possibly due to your recent infection and abscess. -We will monitor this with follow-up imaging if needed after your trip. -Consider an MRI/MRCP if the common bile duct remains enlarged.  UNINTENTIONAL WEIGHT LOSS: You have lost weight likely due to your recent illness and dietary restrictions. -Increase your caloric intake and try to snack more often. -Monitor your weight and nutritional status.  NAUSEA AND DIARRHEA: Your nausea and diarrhea are likely due to antibiotic use. -Continue taking Zofran  as needed for nausea. -Ensure you stay hydrated. -Maintain regular bowel movements with Miralax as needed.  You have been scheduled for a CT scan of the  abdomen and pelvis at [facility].  [address] [place], [place] [address] You are scheduled on 04/26/2024 at 3:30 to arrive by 3:15 pm for a 5:30 pm exam. You will have to drink oral contrast at 3:30 pm and at 4:30 pm. Test will be at 5:30 pm   If you have any questions regarding your exam or if you need to reschedule, you may call [facility] at [phone] between the hours of 8:00 am and 5:00 pm, Monday-Friday.   We will refer you to [facility], They will contact you with an appointment  Due to recent changes in healthcare laws, you may see the results of your imaging and laboratory studies on MyChart before your provider has had a chance to review them.  We understand that in some cases there may be results that are confusing or concerning to you. Not all laboratory results come back in the same time frame and the provider may be waiting for multiple results in order to interpret others.  Please give us  48 hours in order for your provider to thoroughly review all the results before contacting the office for clarification of your results. \  I appreciate the  opportunity to care for you  Thank You   Kavitha Nandigam , MD

## 2024-04-22 NOTE — Progress Notes (Signed)
 Brenda Dyer    [id number]    07/16/1955  Primary Care Physician:Espinoza, Alejandra, DO  Referring Physician: Cathlyn JAYSON Nikki Bobie FORBES, MD [address] [place],  [place] [address]   Chief complaint:  Diverticulitis  Discussed the use of AI scribe software for clinical note transcription with the patient, who gave verbal consent to proceed.  History of Present Illness Brenda Dyer is a 68 year old female with diverticulitis who presents for follow-up of a diverticular abscess.  Diverticulitis and diverticular abscess - Recent episode of diverticulitis complicated by a diverticular abscess - Initial management with oral antibiotics - Development of nausea and vomiting requiring emergency room evaluation - Abscess measured approximately six centimeters in length - Similar symptoms occurred two to three weeks prior to the current episode, reminiscent of a previous episode in the spring - Current episode perceived as more severe than previous episodes - Concern regarding recurrence and severity of symptoms  Gastrointestinal symptoms and bowel habits - Nausea and vomiting during the acute episode - Regular bowel movements, at least once or twice daily - Stools are not well-formed - Currently using Miralax, a quarter teaspoon in the morning and at night, to maintain bowel regularity  Nutritional status and weight loss - Initial intentional weight loss, followed by an additional unintentional five-pound weight loss since onset of current symptoms - Cautious dietary intake, introducing soft foods and cooked vegetables - Concern about when to resume higher fiber foods  Travel and lifestyle concerns - Planned international trip in four weeks - Concern about managing gastrointestinal symptoms and diverticulitis during travel    CT abdomen pelvis with contrast April 15, 2024 1. Wall thickening of the mid sigmoid colon with minimal  surrounding inflammatory change and scattered diverticula. The location is similar to 09/2023. Findings may be related to focal colitis over diverticulitis. Neoplasm cannot be excluded given the appearance. 2. Ill-defined enhancing hypodense areas within the sigmoid colon wall measuring up to 6.9 x 1.5 x 1.8 cm; findings may be related to abscess, but neoplasm is not excluded given minimal surrounding inflammation. 3. Mild intrahepatic biliary ductal dilatation with a 9 mm common bile duct. No cholelithiasis or cholecystitis. Correlate with lab values. This can be further evaluated with MRCP or ERCP.  CT abdomen pelvis with contrast October 09, 2023 1. In appropriate clinical settings, findings favor subacute/smoldering diverticulitis of the proximal sigmoid colon, as described above. No associated abscess, loculated collection or pneumoperitoneum. 2. Multiple other nonacute observations, as described above. 3. Aortic atherosclerosis.  Colonoscopy June 03, 2023 - One 3 mm polyp [hyperplastic] in the rectum, removed with a cold snare. Resected and retrieved. - Severe diverticulosis in the sigmoid colon and in the descending colon. There was narrowing of the colon in association with the diverticular opening. There was evidence of diverticular spasm. Peri- diverticular erythema was seen. There was evidence of an impacted diverticulum. - Non- bleeding external and internal hemorrhoids.  Colonoscopy March 13, 2018 - One 5 mm polyp in the transverse colon, removed with a cold snare. Resected and retrieved. - One 2 mm polyp in the transverse colon, removed with a cold biopsy forceps. Resected and retrieved.  [Tubular adenomas]- Severe diverticulosis in the sigmoid colon and in the descending colon. There was narrowing of the colon in association with the diverticular opening. Peri- diverticular erythema was seen. There was evidence of an impacted diverticulum. - Non- bleeding internal  hemorrhoids.  Outpatient Encounter Medications as of 04/22/2024  Medication Sig   amoxicillin-clavulanate (AUGMENTIN) 875-125 MG tablet Take 1 tablet by mouth in the morning, at noon, and at bedtime.   CALCIUM PO Take 1 tablet by mouth daily.   cetirizine (ZYRTEC) 10 MG tablet Take 10 mg by mouth daily as needed for allergies or rhinitis.   Cholecalciferol (VITAMIN D3 PO) Take 1 capsule by mouth daily.   diclofenac  sodium (VOLTAREN ) 1 % GEL Apply 2 g topically 4 (four) times daily.   ibuprofen  (ADVIL ) 800 MG tablet Take 1 tablet (800 mg total) by mouth every 8 (eight) hours as needed.   Ibuprofen  200 MG CAPS Take 200 mg by mouth every 6 (six) hours as needed (for pain or headaches).   lactobacillus acidophilus (BACID) TABS tablet Take 1 tablet by mouth daily.   MAGNESIUM CITRATE PO Take 150 mg by mouth daily.   metroNIDAZOLE (METROCREAM) 0.75 % cream Apply 1 Application topically 2 (two) times daily as needed (for rosacea flares).   ondansetron  (ZOFRAN -ODT) 4 MG disintegrating tablet Take 1 tablet (4 mg total) by mouth in the morning, at noon, and at bedtime. Take 1 tablet 30 minutes prior to taking each antibiotic dose.   triamcinolone  cream (KENALOG ) 0.1 % Apply twice a day to rash as needed for itching.   triamcinolone  cream (KENALOG ) 0.1 % Apply to rash twice daily as needed for itch (Patient taking differently: Apply 1 Application topically 2 (two) times daily as needed (for rashes/itching).)   celecoxib  (CELEBREX ) 100 MG capsule Take 1 capsule (100 mg total) by mouth 2 (two) times daily. (Patient not taking: Reported on 04/22/2024)   COVID-19 mRNA bivalent vaccine, Pfizer, (PFIZER COVID-19 VAC BIVALENT) injection Inject into the muscle. (Patient not taking: Reported on 04/22/2024)   influenza vac split quadrivalent PF (FLUARIX  QUADRIVALENT) 0.5 ML injection Inject into the muscle. (Patient not taking: Reported on 04/22/2024)   [DISCONTINUED] Magnesium Chloride 64 MG TBEC Take 1 tablet by  mouth daily.   No facility-administered encounter medications on file as of 04/22/2024.    Allergies as of 04/22/2024 - Review Complete 04/22/2024  Allergen Reaction Noted   Codeine Nausea Only 02/06/2015   Linaclotide Swelling and Other (See Comments) 05/16/2023    Past Medical History:  Diagnosis Date   Abnormal Pap smear of cervix 1997, 2006-2009   --1997 pt. had colpo/cryotherapy to cervix in [place], [place], 2006-2009 abn.paps(dysplasia) and colposcopies but no treatment to cervix--paps returned to normal   Arthritis    lumbarspine   Cataract    bilateral (early)   Diverticulosis    Genital warts 1984   Glaucoma    Suspect  at this time   Kidney stones    Osteopenia    hips and legs   Osteoporosis    wrists only   Scoliosis    STD (sexually transmitted disease)    Hx condyloma    Past Surgical History:  Procedure Laterality Date   CESAREAN SECTION  1991   GYNECOLOGIC CRYOSURGERY  1997   done in [place], [place]   MANDIBLE SURGERY Bilateral 2015   PELVIC LAPAROSCOPY  1995   d/t back pain--nl--in [place], [place]    Family History  Problem Relation Age of Onset   Colon polyps Mother    Colonic polyp Mother        11/2018   Stroke Mother    Heart attack Father    Diabetes Father    Other Father        vascular/cluster H/As  Stroke Father    Hyperlipidemia Brother    Breast cancer Neg Hx    Colon cancer Neg Hx    Esophageal cancer Neg Hx    Stomach cancer Neg Hx    Rectal cancer Neg Hx     Social History   Socioeconomic History   Marital status: Married    Spouse name: Not on file   Number of children: 1   Years of education: Not on file   Highest education level: Not on file  Occupational History   Occupation: retired  Tobacco Use   Smoking status: Former   Smokeless tobacco: Never  Vaping Use   Vaping status: Never Used  Substance and Sexual Activity   Alcohol use: Yes    Alcohol/week: 2.0 standard drinks of alcohol    Types: 2 Standard drinks or  equivalent per week    Comment: occasionally   Drug use: No   Sexual activity: Not Currently    Partners: Male    Birth control/protection: Post-menopausal  Other Topics Concern   Not on file  Social History Narrative   Not on file   Social Drivers of Health   Financial Resource Strain: Not on file  Food Insecurity: No Food Insecurity (04/16/2024)   Hunger Vital Sign    Worried About Running Out of Food in the Last Year: Never true    Ran Out of Food in the Last Year: Never true  Transportation Needs: No Transportation Needs (04/16/2024)   PRAPARE - Administrator, Civil Service (Medical): No    Lack of Transportation (Non-Medical): No  Physical Activity: Not on file  Stress: Not on file  Social Connections: Moderately Integrated (04/15/2024)   Social Connection and Isolation Panel    Frequency of Communication with Friends and Family: More than three times a week    Frequency of Social Gatherings with Friends and Family: Never    Attends Religious Services: Never    Database administrator or Organizations: Yes    Attends Banker Meetings: Never    Marital Status: Married  Catering manager Violence: Not At Risk (04/16/2024)   Humiliation, Afraid, Rape, and Kick questionnaire    Fear of Current or Ex-Partner: No    Emotionally Abused: No    Physically Abused: No    Sexually Abused: No      Review of systems: All other review of systems negative except as mentioned in the HPI.   Physical Exam: Vitals:   04/22/24 1320  BP: 108/64  Pulse: 87  SpO2: 99%   Body mass index is 19.97 kg/m. Gen:      No acute distress HEENT:  sclera anicteric Abd:      soft, non-tender; no palpable masses, no distension Ext:    No edema Neuro: alert and oriented x 3 Psych: normal mood and affect  Data Reviewed:  Reviewed labs, radiology imaging, old records and pertinent past GI work up   Assessment and Plan Assessment & Plan Sigmoid diverticulitis  with abscess Recurrent sigmoid diverticulitis with a 6 cm abscess, larger and more severe than the previous episode in the spring.  Hospitalized for 4 days, received IV antibiotics and was discharged home on 04/18/2024 with a 10-day course of Augmentin three times a day. The abdomen is soft with no tenderness, indicating improvement. - Continue Augmentin three times a day for 10 days - Order follow-up CT scan early next week to ensure abscess resolution - Refer to colorectal surgeon Dr. Bernarda  Thomas for consultation for possible sigmoid resection to prevent recurrent diverticulitis. Last colonoscopy December 2024, he is up-to-date with colorectal cancer screening  Diverticulosis of colon Chronic diverticulosis with thickened colon wall. Dietary management is advised to prevent complications. Encouraged to maintain regular bowel movements to prevent constipation, given the diverticulitis. - Advise dietary modifications including cooked vegetables, fruits, and whole grains - Encourage regular bowel movements with Miralax as needed  Enlarged common bile duct Common bile duct measured at 9 mm, slightly larger than expected for her age. No other alarming features noted.  - Consider MRI/MRCP if the common bile duct remains enlarged on repeat CT  Unintentional weight loss Unintentional weight loss likely due to recent illness and dietary restrictions. Encouraged to maintain nutritional intake to regain weight. - Encourage increased caloric intake and snacking - Monitor weight and nutritional status  Nausea and diarrhea Nausea and diarrhea likely secondary to antibiotic use. Zofran  is being used to manage nausea. - Continue Zofran  for nausea as needed - Ensure adequate hydration - Maintain regular bowel movements with Miralax as needed  Return in 2 months    This visit required >40 minutes of patient care (this includes precharting, chart review, review of results, face-to-face time used for  counseling as well as treatment plan and follow-up. The patient was provided an opportunity to ask questions and all were answered. The patient agreed with the plan and demonstrated an understanding of the instructions.  LOIS Wilkie Mcgee , MD    CC: Cathlyn JAYSON Nikki Bobie*

## 2024-04-25 ENCOUNTER — Encounter: Payer: Self-pay | Admitting: Obstetrics and Gynecology

## 2024-04-26 ENCOUNTER — Ambulatory Visit (HOSPITAL_BASED_OUTPATIENT_CLINIC_OR_DEPARTMENT_OTHER)
Admission: RE | Admit: 2024-04-26 | Discharge: 2024-04-26 | Disposition: A | Discharge status: Home / Self Care | Source: Ambulatory Visit | Attending: Gastroenterology | Admitting: Gastroenterology

## 2024-04-26 DIAGNOSIS — K5720 Diverticulitis of large intestine with perforation and abscess without bleeding: Secondary | ICD-10-CM | POA: Insufficient documentation

## 2024-04-26 MED ORDER — IOHEXOL 300 MG/ML  SOLN
100.0000 mL | Freq: Once | INTRAMUSCULAR | Status: AC | PRN
  Administered 2024-04-26: 85 mL via INTRAVENOUS

## 2024-04-29 ENCOUNTER — Encounter: Payer: Self-pay | Admitting: Gastroenterology

## 2024-04-29 ENCOUNTER — Ambulatory Visit: Payer: Self-pay | Admitting: Gastroenterology

## 2024-05-03 ENCOUNTER — Other Ambulatory Visit: Payer: Self-pay

## 2024-05-03 MED ORDER — AMOXICILLIN-POT CLAVULANATE 875-125 MG PO TABS
1.0000 | ORAL_TABLET | Freq: Two times a day (BID) | ORAL | 0 refills | Status: AC
Start: 2024-05-03 — End: 2024-05-13

## 2024-05-03 MED ORDER — ONDANSETRON 4 MG PO TBDP: 4.0000 mg | ORAL_TABLET | Freq: Every day | ORAL | 0 refills | Status: DC | PRN

## 2024-05-04 ENCOUNTER — Other Ambulatory Visit: Payer: Self-pay

## 2024-05-04 MED ORDER — ONDANSETRON 4 MG PO TBDP: 4.0000 mg | ORAL_TABLET | Freq: Two times a day (BID) | ORAL | 0 refills | Status: DC | PRN

## 2024-05-05 ENCOUNTER — Telehealth: Payer: Self-pay

## 2024-05-05 ENCOUNTER — Other Ambulatory Visit (HOSPITAL_COMMUNITY): Payer: Self-pay

## 2024-05-05 NOTE — Telephone Encounter (Signed)
 Pharmacy Patient Advocate Encounter   Received notification from CoverMyMeds that prior authorization for Ondansetron  4MG  dispersible tablets is required/requested.   Insurance verification completed.   The patient is insured through [employer].   Per test claim: PA required; PA submitted to above mentioned insurance via Latent Key/confirmation #/EOC [id number] Status is pending

## 2024-05-06 ENCOUNTER — Other Ambulatory Visit (HOSPITAL_COMMUNITY): Payer: Self-pay

## 2024-05-06 NOTE — Telephone Encounter (Signed)
 Spoke with the pt and she tells me that she has it all worked out and does not need anything further

## 2024-05-06 NOTE — Telephone Encounter (Signed)
 Please check if regular tabs are covered, zofran  4mg  BID PRN X 10 days

## 2024-05-06 NOTE — Telephone Encounter (Signed)
 Insurance pays maximum of 36 tablets per 67 days. Last filled 04-19-2024 and next fill available 06-25-2024. 29 tablets filled, only 7 tablets remaining available to fill. 7 tablets co-pay is $0.95

## 2024-05-06 NOTE — Telephone Encounter (Signed)
 Dr Shila see message regarding ondansetron  denial by insurance

## 2024-05-06 NOTE — Telephone Encounter (Signed)
 Pharmacy Patient Advocate Encounter  Received notification from [facility] that Prior Authorization for Ondansetron  4MG  dispersible tablets has been DENIED.  Full denial letter will be uploaded to the media tab. See denial reason below.  The use of this medication for the treatment or prevention of nausea or vomiting without it being related to radiation, cancer chemotherapy, pregnancy, or being post-operative does not establish medical necessity for this drug. Medical necessity is determined by adherence to generally accepted standards of medical practice in the [place] , is clinically appropriate, in terms of type, frequency, extent, site, duration and considered effective for the patient's illness, injury, disease, or its symptoms.   PA #/Case ID/Reference #: [id number]

## 2024-05-06 NOTE — Telephone Encounter (Signed)
Okay, please inform patient

## 2024-05-07 ENCOUNTER — Other Ambulatory Visit (HOSPITAL_BASED_OUTPATIENT_CLINIC_OR_DEPARTMENT_OTHER): Payer: Self-pay

## 2024-05-07 MED ORDER — FLUZONE HIGH-DOSE 0.5 ML IM SUSY
0.5000 mL | PREFILLED_SYRINGE | Freq: Once | INTRAMUSCULAR | 0 refills | Status: AC
  Filled 2024-05-07: qty 0.5, 1d supply, fill #0

## 2024-05-10 ENCOUNTER — Other Ambulatory Visit: Payer: Self-pay | Admitting: Obstetrics and Gynecology

## 2024-05-10 DIAGNOSIS — Z1231 Encounter for screening mammogram for malignant neoplasm of breast: Secondary | ICD-10-CM

## 2024-05-11 ENCOUNTER — Other Ambulatory Visit (HOSPITAL_BASED_OUTPATIENT_CLINIC_OR_DEPARTMENT_OTHER): Payer: Self-pay

## 2024-06-09 ENCOUNTER — Ambulatory Visit

## 2024-06-09 DIAGNOSIS — Z1231 Encounter for screening mammogram for malignant neoplasm of breast: Secondary | ICD-10-CM

## 2024-06-10 ENCOUNTER — Other Ambulatory Visit: Payer: Self-pay

## 2024-06-10 LAB — CBC
HCT: 37.1 % (ref 36.0–46.0)
Hemoglobin: 12.3 g/dL (ref 12.0–15.0)
MCH: 30.8 pg (ref 26.0–34.0)
MCHC: 33.2 g/dL (ref 30.0–36.0)
MCV: 92.8 fL (ref 80.0–100.0)
Platelets: 233 K/uL (ref 150–400)
RBC: 4 MIL/uL (ref 3.87–5.11)
RDW: 13.5 % (ref 11.5–15.5)
WBC: 6.6 K/uL (ref 4.0–10.5)
nRBC: 0 % (ref 0.0–0.2)

## 2024-06-10 NOTE — Progress Notes (Deleted)
 68 y.o. G57P1001 Married Caucasian female here for annual exam.    PCP: Chet Mad, DO   Patient's last menstrual period was 07/02/2007.           Sexually active: No.  The current method of family planning is post menopausal status.    Menopausal hormone therapy:  n/a Exercising: {yes no:314532}  {types:19826} Smoker:  Former   OB History  Gravida Para Term Preterm AB Living  1 1 1   1   SAB IAB Ectopic Multiple Live Births          # Outcome Date GA Lbr Len/2nd Weight Sex Type Anes PTL Lv  1 Term              HEALTH MAINTENANCE: Last 2 paps:  05/19/23 neg HR HPV neg, 03/15/20 neg HR HPV neg History of abnormal Pap or positive HPV:  no Mammogram:   04/30/23 Breast Density Cat C, BIRADS Cat 1 neg  Colonoscopy:  06/03/23  Bone Density:  11/04/22  Result  osteoporotic    Immunization History  Administered Date(s) Administered   INFLUENZA, HIGH DOSE SEASONAL PF 05/07/2024   PFIZER(Purple Top)SARS-COV-2 Vaccination 09/23/2019, 10/18/2019   Pfizer Covid-19 Vaccine Bivalent Booster 85yrs & up 05/25/2021      reports that she has quit smoking. She has never used smokeless tobacco. She reports current alcohol use of about 2.0 standard drinks of alcohol per week. She reports that she does not use drugs.  Past Medical History:  Diagnosis Date   Abnormal Pap smear of cervix 1997, 2006-2009   --1997 pt. had colpo/cryotherapy to cervix in [place], [place], 2006-2009 abn.paps(dysplasia) and colposcopies but no treatment to cervix--paps returned to normal   Abscess of sigmoid colon due to diverticulitis 2025   Arthritis    lumbarspine   Cataract    bilateral (early)   Diverticulosis    Genital warts 1984   Glaucoma    Suspect  at this time   Kidney stones    Osteopenia    hips and legs   Osteoporosis    wrists only   Scoliosis    STD (sexually transmitted disease)    Hx condyloma    Past Surgical History:  Procedure Laterality Date   CESAREAN SECTION  1991    GYNECOLOGIC CRYOSURGERY  1997   done in [place], [place]   MANDIBLE SURGERY Bilateral 2015   PELVIC LAPAROSCOPY  1995   d/t back pain--nl--in [place], [place]    Current Outpatient Medications  Medication Sig Dispense Refill   CALCIUM PO Take 1 tablet by mouth daily.     celecoxib  (CELEBREX ) 100 MG capsule Take 1 capsule (100 mg total) by mouth 2 (two) times daily. (Patient not taking: Reported on 04/22/2024) 30 capsule 1   cetirizine (ZYRTEC) 10 MG tablet Take 10 mg by mouth daily as needed for allergies or rhinitis.     Cholecalciferol (VITAMIN D3 PO) Take 1 capsule by mouth daily.     COVID-19 mRNA bivalent vaccine, Pfizer, (PFIZER COVID-19 VAC BIVALENT) injection Inject into the muscle. (Patient not taking: Reported on 04/22/2024) 0.3 mL 0   diclofenac  sodium (VOLTAREN ) 1 % GEL Apply 2 g topically 4 (four) times daily. 1 Tube 5   ibuprofen  (ADVIL ) 800 MG tablet Take 1 tablet (800 mg total) by mouth every 8 (eight) hours as needed. 30 tablet 2   Ibuprofen  200 MG CAPS Take 200 mg by mouth every 6 (six) hours as needed (for pain or headaches).  influenza vac split quadrivalent PF (FLUARIX  QUADRIVALENT) 0.5 ML injection Inject into the muscle. (Patient not taking: Reported on 04/22/2024) 0.5 mL 0   lactobacillus acidophilus (BACID) TABS tablet Take 1 tablet by mouth daily.     MAGNESIUM CITRATE PO Take 150 mg by mouth daily.     metroNIDAZOLE (METROCREAM) 0.75 % cream Apply 1 Application topically 2 (two) times daily as needed (for rosacea flares).     ondansetron  (ZOFRAN -ODT) 4 MG disintegrating tablet Take 1 tablet (4 mg total) by mouth 2 (two) times daily as needed for nausea or vomiting. Take 1 tablet 30 minutes prior to taking each antibiotic dose. 20 tablet 0   triamcinolone  cream (KENALOG ) 0.1 % Apply twice a day to rash as needed for itching. 15 g 0   triamcinolone  cream (KENALOG ) 0.1 % Apply to rash twice daily as needed for itch (Patient taking differently: Apply 1 Application topically 2  (two) times daily as needed (for rashes/itching).) 80 g 0   No current facility-administered medications for this visit.    ALLERGIES: Codeine and Linaclotide  Family History  Problem Relation Age of Onset   Colon polyps Mother    Colonic polyp Mother        11/2018   Stroke Mother    Heart attack Father    Diabetes Father    Other Father        vascular/cluster H/As   Stroke Father    Hyperlipidemia Brother    Breast cancer Neg Hx    Colon cancer Neg Hx    Esophageal cancer Neg Hx    Stomach cancer Neg Hx    Rectal cancer Neg Hx     Review of Systems  PHYSICAL EXAM:  LMP 07/02/2007     General appearance: alert, cooperative and appears stated age Head: normocephalic, without obvious abnormality, atraumatic Neck: no adenopathy, supple, symmetrical, trachea midline and thyroid normal to inspection and palpation Lungs: clear to auscultation bilaterally Breasts: normal appearance, no masses or tenderness, No nipple retraction or dimpling, No nipple discharge or bleeding, No axillary adenopathy Heart: regular rate and rhythm Abdomen: soft, non-tender; no masses, no organomegaly Extremities: extremities normal, atraumatic, no cyanosis or edema Skin: skin color, texture, turgor normal. No rashes or lesions Lymph nodes: cervical, supraclavicular, and axillary nodes normal. Neurologic: grossly normal  Pelvic: External genitalia:  no lesions              No abnormal inguinal nodes palpated.              Urethra:  normal appearing urethra with no masses, tenderness or lesions              Bartholins and Skenes: normal                 Vagina: normal appearing vagina with normal color and discharge, no lesions              Cervix: no lesions              Pap taken: {yes no:314532} Bimanual Exam:  Uterus:  normal size, contour, position, consistency, mobility, non-tender              Adnexa: no mass, fullness, tenderness              Rectal exam: {yes no:314532}.  Confirms.               Anus:  normal sphincter tone, no lesions  Chaperone was present for exam:  {BSCHAPERONE:31226::Emily  F, CMA}  ASSESSMENT: Well woman visit with gynecologic exam.  PHQ-2-9: ***  ***  PLAN: Mammogram screening discussed. Self breast awareness reviewed. Pap and HRV collected:  {yes no:314532} Guidelines for Calcium, Vitamin D , regular exercise program including cardiovascular and weight bearing exercise. Medication refills:  *** {LABS (Optional):23779} Follow up:  ***    Additional counseling given.  {yes [id number]. ***  total time was spent for this patient encounter, including preparation, face-to-face counseling with the patient, coordination of care, and documentation of the encounter in addition to doing the well woman visit with gynecologic exam.

## 2024-06-10 NOTE — ED Triage Notes (Signed)
 Pt POV reporting sore throat past few days, strep and covid neg at UC a few days ago.

## 2024-06-11 ENCOUNTER — Emergency Department (HOSPITAL_BASED_OUTPATIENT_CLINIC_OR_DEPARTMENT_OTHER)
Admission: EM | Admit: 2024-06-11 | Discharge: 2024-06-11 | Disposition: A | Discharge status: Home / Self Care | Attending: Emergency Medicine | Admitting: Emergency Medicine

## 2024-06-11 DIAGNOSIS — J029 Acute pharyngitis, unspecified: Secondary | ICD-10-CM | POA: Insufficient documentation

## 2024-06-11 LAB — BASIC METABOLIC PANEL WITH GFR
Anion gap: 12 (ref 5–15)
BUN: 17 mg/dL (ref 8–23)
CO2: 24 mmol/L (ref 22–32)
Calcium: 10.1 mg/dL (ref 8.9–10.3)
Chloride: 102 mmol/L (ref 98–111)
Creatinine, Ser: 0.65 mg/dL (ref 0.44–1.00)
GFR, Estimated: >60 mL/min (ref >60)
Glucose, Bld: 114 mg/dL — ABNORMAL HIGH (ref 70–99)
Potassium: 4 mmol/L (ref 3.5–5.1)
Sodium: 138 mmol/L (ref 135–145)

## 2024-06-11 MED ORDER — LIDOCAINE VISCOUS HCL 2 % MT SOLN
15.0000 mL | Freq: Once | OROMUCOSAL | Status: AC
  Administered 2024-06-11: 15 mL via OROMUCOSAL
  Filled 2024-06-11: qty 15

## 2024-06-11 MED ORDER — PREDNISONE 10 MG (21) PO TBPK: ORAL_TABLET | ORAL | 0 refills | Status: DC

## 2024-06-11 MED ORDER — PREDNISONE 50 MG PO TABS
60.0000 mg | ORAL_TABLET | Freq: Once | ORAL | Status: AC
  Administered 2024-06-11: 60 mg via ORAL
  Filled 2024-06-11: qty 1

## 2024-06-11 NOTE — ED Provider Notes (Signed)
 [place] EMERGENCY DEPARTMENT AT [facility]  Provider Note  CSN: [id number] Arrival date & time: 06/10/24 2333  History Chief Complaint  Patient presents with   Sore Throat    Brenda Dyer is a 68 y.o. female with no significant PMH reports 5-6 days of sore throat, no fever. Mild hoarse voice. Some nasal congestion started today. Went to UC on 12/10 and had neg viral and strep testing. Still having significant discomfort and unable to sleep. She has been taking some OTC Dayquil/Nyquil and motrin .    Home Medications Prior to Admission medications  Medication Sig Start Date End Date Taking? Authorizing Provider  predniSONE (STERAPRED UNI-PAK 21 TAB) 10 MG (21) TBPK tablet 10mg  Tabs, 6 day taper. Use as directed 06/11/24  Yes Roselyn Carlin NOVAK, MD  CALCIUM PO Take 1 tablet by mouth daily.    [provider]  cetirizine (ZYRTEC) 10 MG tablet Take 10 mg by mouth daily as needed for allergies or rhinitis.    [provider]  Cholecalciferol (VITAMIN D3 PO) Take 1 capsule by mouth daily.    [provider]  diclofenac  sodium (VOLTAREN ) 1 % GEL Apply 2 g topically 4 (four) times daily. 08/25/17   Jerri Kay HERO, MD  ibuprofen  (ADVIL ) 800 MG tablet Take 1 tablet (800 mg total) by mouth every 8 (eight) hours as needed. 02/29/20   Jerri Kay HERO, MD  Ibuprofen  200 MG CAPS Take 200 mg by mouth every 6 (six) hours as needed (for pain or headaches).    [provider]  lactobacillus acidophilus (BACID) TABS tablet Take 1 tablet by mouth daily.    [provider]  MAGNESIUM CITRATE PO Take 150 mg by mouth daily.    [provider]  metroNIDAZOLE (METROCREAM) 0.75 % cream Apply 1 Application topically 2 (two) times daily as needed (for rosacea flares). 05/20/22   [provider]  ondansetron  (ZOFRAN -ODT) 4 MG disintegrating tablet Take 1 tablet (4 mg total) by mouth 2 (two) times daily as needed for nausea or vomiting.  Take 1 tablet 30 minutes prior to taking each antibiotic dose. 05/04/24   Nandigam, Kavitha V, MD  triamcinolone  cream (KENALOG ) 0.1 % Apply twice a day to rash as needed for itching. 05/27/23     triamcinolone  cream (KENALOG ) 0.1 % Apply to rash twice daily as needed for itch Patient taking differently: Apply 1 Application topically 2 (two) times daily as needed (for rashes/itching). 12/23/23        Allergies    Codeine and Linaclotide   Review of Systems   Review of Systems Please see HPI for pertinent positives and negatives  Physical Exam BP 118/64   Pulse 75   Temp 97.6 F (36.4 C) (Temporal)   Resp 19   Ht 5' 6 (1.676 m)   Wt 54.4 kg   LMP 07/02/2007   SpO2 98%   BMI 19.37 kg/m   Physical Exam Vitals and nursing note reviewed.  Constitutional:      Appearance: Normal appearance.  HENT:     Head: Normocephalic and atraumatic.     Right Ear: Tympanic membrane normal.     Left Ear: Tympanic membrane normal.     Nose: Nose normal.     Mouth/Throat:     Mouth: Mucous membranes are moist. No oral lesions.     Pharynx: Posterior oropharyngeal erythema present. No pharyngeal swelling or oropharyngeal exudate.     Tonsils: No tonsillar exudate.  Eyes:  Extraocular Movements: Extraocular movements intact.     Conjunctiva/sclera: Conjunctivae normal.  Cardiovascular:     Rate and Rhythm: Normal rate.  Pulmonary:     Effort: Pulmonary effort is normal.     Breath sounds: Normal breath sounds. No wheezing or rales.  Abdominal:     General: Abdomen is flat.     Palpations: Abdomen is soft.     Tenderness: There is no abdominal tenderness.  Musculoskeletal:        General: No swelling. Normal range of motion.     Cervical back: Neck supple.  Lymphadenopathy:     Cervical: No cervical adenopathy.  Skin:    General: Skin is warm and dry.  Neurological:     General: No focal deficit present.     Mental Status: She is alert.  Psychiatric:        Mood and Affect:  Mood normal.     ED Results / Procedures / Treatments   EKG None  Procedures Procedures  Medications Ordered in the ED Medications  lidocaine  (XYLOCAINE ) 2 % viscous mouth solution 15 mL (has no administration in time range)  predniSONE (DELTASONE) tablet 60 mg (has no administration in time range)    Initial Impression and Plan  Patient here with sore throat, exam and vitals are reassuring. Labs done in triage with normal CBC and BMP. No concern for sepsis or abscess. Exam is not consistent with strep, so will defer additional lab testing tonight. Recommend OTC analgesia, will trial a course of steroids for symptom relief. PCP follow up, RTED for any other concerns.    ED Course       MDM Rules/Calculators/A&P Medical Decision Making Problems Addressed: Pharyngitis, unspecified etiology: acute illness or injury  Amount and/or Complexity of Data Reviewed Labs: ordered. Decision-making details documented in ED Course.  Risk Prescription drug management.     Final Clinical Impression(s) / ED Diagnoses Final diagnoses:  Pharyngitis, unspecified etiology    Rx / DC Orders ED Discharge Orders          Ordered    predniSONE (STERAPRED UNI-PAK 21 TAB) 10 MG (21) TBPK tablet        06/11/24 0140             Roselyn Carlin NOVAK, MD 06/11/24 0140

## 2024-06-14 ENCOUNTER — Ambulatory Visit: Payer: Federal, State, Local not specified - PPO | Admitting: Obstetrics and Gynecology

## 2024-06-14 ENCOUNTER — Ambulatory Visit: Payer: Self-pay | Admitting: General Surgery

## 2024-06-14 NOTE — H&P (Signed)
 REFERRING PHYSICIAN:  Nandigam, Kavitha V, MD  PROVIDER:  BERNARDA WANDA NED, MD  MRN: [id number] DOB: 10/20/1955 DATE OF ENCOUNTER: 06/14/2024  Subjective   Chief Complaint: New Consultation ( NEW PATIENT - Abscess of sigmoid colon/diverticulitis)     History of Present Illness: Brenda Dyer is a 68 y.o. female who is seen today as an office consultation at the request of Dr. Nandigam for evaluation of New Consultation ( NEW PATIENT - Abscess of sigmoid colon/diverticulitis) .   68 year old female who developed some left lower quadrant pain in April 2025.  She underwent a CT which showed subacute diverticulitis of the proximal sigmoid colon.  This resolved with antibiotics.  She then developed new symptoms in early October 2025 while on vacation.  This subsequently required a hospitalization for severe diverticulitis with possible colonic wall abscess.  This resolved with IV antibiotics.  She has now recovered from this and is using MiraLAX daily to keep her bowel habits regular.  She does endorse a longstanding history of constipation that has worsened over the last few years.  Review of Systems: A complete review of systems was obtained from the patient.  I have reviewed this information and discussed as appropriate with the patient.  See HPI as well for other ROS.    Medical History: Past Medical History:  Diagnosis Date   Abscess of sigmoid colon due to diverticulitis    Arthritis    Diverticulosis    Genital warts    Glaucoma (increased eye pressure)    Kidney stones    Osteopenia    Osteoporosis    Scoliosis     There is no problem list on file for this patient.   Past Surgical History:  Procedure Laterality Date   .Pelvic laparoscopy     CESAREAN SECTION     Gynecologic cryosurgery      Mandible surgery (Bilateral)       Allergies  Allergen Reactions   Linaclotide Other (See Comments) and Swelling    Leg swelling   Codeine Nausea    Current  Outpatient Medications on File Prior to Visit  Medication Sig Dispense Refill   cetirizine (ZYRTEC) 10 MG tablet 1 tablet Orally Once a day As needed     cholecalciferol (VITAMIN D3) 1000 unit tablet 1 tablet Orally Once a day     diclofenac  (VOLTAREN ) 1 % topical gel Apply 2 g Externally Four times a day As needed     guaiFENesin 1,200 mg Ta12 Take by mouth     ibuprofen  200 mg Cap Take 600 mg by mouth every 6 (six) hours     magnesium citrate 125 mg Cap 150 mg 1 capsule Orally Once a day     metroNIDAZOLE (METROCREAM) 0.75 % cream Apply 1 Application topically 2 (two) times daily as needed     predniSONE  (DELTASONE ) 10 mg tablet pack 10mg  Tabs, 6 day taper. Use as directed     triamcinolone  0.1 % cream Apply 1 Application topically 2 (two) times daily as needed     No current facility-administered medications on file prior to visit.    Family History  Problem Relation Age of Onset   Colonic polyp Mother    Colon polyps Mother    Stroke Mother    Myocardial Infarction (Heart attack) Father    Diabetes Father    Stroke Father    Hyperlipidemia (Elevated cholesterol) Brother    Esophageal cancer Neg Hx    Stomach cancer Neg Hx  Rectal cancer Neg Hx    Breast cancer Neg Hx    Colon cancer Neg Hx      Social History   Tobacco Use  Smoking Status Former  Smokeless Tobacco Never     Social History   Socioeconomic History   Marital status: Married  Tobacco Use   Smoking status: Former   Smokeless tobacco: Never  Substance and Sexual Activity   Alcohol use: Never   Drug use: Never   Sexual activity: Yes    Partners: Male   Social Drivers of Health   Food Insecurity: No Food Insecurity (04/16/2024)   Received from [facility]   Hunger Vital Sign    Within the past 12 months, you worried that your food would run out before you got the money to buy more.: Never true    Within the past 12 months, the food you bought just didn't last and you didn't have money to get  more.: Never true  Transportation Needs: No Transportation Needs (04/16/2024)   Received from [facility]    In the past 12 months, has lack of transportation kept you from medical appointments or from getting medications?: No    In the past 12 months, has lack of transportation kept you from meetings, work, or from getting things needed for daily living?: No  Social Connections: Moderately Integrated (04/15/2024)   Received from [facility]   Social Connection and Isolation Panel    In a typical week, how many times do you talk on the phone with family, friends, or neighbors?: More than three times a week    How often do you get together with friends or relatives?: Never    How often do you attend church or religious services?: Never    Do you belong to any clubs or organizations such as church groups, unions, fraternal or athletic groups, or school groups?: Yes    How often do you attend meetings of the clubs or organizations you belong to?: Never    Are you married, widowed, divorced, separated, never married, or living with a partner?: Married  Housing Stability: Unknown (06/14/2024)   Housing Stability Vital Sign    Homeless in the Last Year: No    Objective:    Vitals:   06/14/24 0909  BP: 139/75  Pulse: 77  Temp: 36.8 C (98.2 F)  TempSrc: Temporal  SpO2: 99%  Weight: 55.2 kg (121 lb 9.6 oz)  Height: 167.6 cm (5' 6)  PainSc: 0-No pain     Exam Gen: NAD Abd: soft   Labs, Imaging and Diagnostic Testing: CT scans from April and September 2025 reviewed.  Patient appears to have significant inflammation of the proximate sigmoid colon.  Assessment and Plan:  Diverticulitis of large intestine with perforation and abscess without bleeding  (primary encounter diagnosis).  We discussed that today that due to the severity of her diverticulitis and the recurrent nature, she would be a candidate for sigmoidectomy.  We discussed the surgery in detail  today and discussed that this is an elective procedure to prophylactically reduce her risk of recurrence.  We did discuss that there is still approximately a 5% chance of recurrence even with the surgery.  We discussed the typical recovery process.  All questions were answered.  Patient would like to go ahead and proceed with scheduling, but she is still undecided on surgery at this time. The surgery and anatomy were described to the patient as well as the  risks of surgery and the possible complications.  These include: Bleeding, deep abdominal infections and possible wound complications such as hernia and infection, damage to adjacent structures, leak of surgical connections, which can lead to other surgeries and possibly an ostomy, possible need for other procedures, such as abscess drains in radiology, possible prolonged hospital stay, possible diarrhea from removal of part of the colon, possible constipation from narcotics, possible bowel, bladder or sexual dysfunction if having rectal surgery, prolonged fatigue/weakness or appetite loss, possible early recurrence of of disease, possible complications of their medical problems such as heart disease or arrhythmias or lung problems, death (less than 1%). I believe the patient understands and wishes to proceed with the surgery.     [name], MD Colon and Rectal Surgery [facility]

## 2024-07-01 ENCOUNTER — Other Ambulatory Visit (HOSPITAL_BASED_OUTPATIENT_CLINIC_OR_DEPARTMENT_OTHER): Payer: Self-pay | Admitting: Obstetrics and Gynecology

## 2024-07-01 DIAGNOSIS — Z1231 Encounter for screening mammogram for malignant neoplasm of breast: Secondary | ICD-10-CM

## 2024-07-02 ENCOUNTER — Encounter (HOSPITAL_BASED_OUTPATIENT_CLINIC_OR_DEPARTMENT_OTHER): Payer: Self-pay | Admitting: Radiology

## 2024-07-02 ENCOUNTER — Ambulatory Visit (HOSPITAL_BASED_OUTPATIENT_CLINIC_OR_DEPARTMENT_OTHER)
Admission: RE | Admit: 2024-07-02 | Discharge: 2024-07-02 | Disposition: A | Discharge status: Home / Self Care | Source: Ambulatory Visit

## 2024-07-02 DIAGNOSIS — Z1231 Encounter for screening mammogram for malignant neoplasm of breast: Secondary | ICD-10-CM | POA: Diagnosis present

## 2024-07-06 ENCOUNTER — Ambulatory Visit: Payer: Self-pay | Admitting: Obstetrics and Gynecology

## 2024-07-19 ENCOUNTER — Encounter: Payer: Self-pay | Admitting: Gastroenterology

## 2024-07-20 NOTE — Telephone Encounter (Signed)
 Please inform patient that I agree with Dr. Lus plan

## 2024-07-26 NOTE — Progress Notes (Signed)
 The patient was identified using 2 approved identifiers. All issues noted in this document were discussed and addressed, Brenda Dyer  voiced understanding and agreement with all preoperative instructions. The patient was emailed the surgery instructions per her request.    Medication Recon: 07-20-24    The patient will be coming in for Lab work on 08-02-24 at 1300  Date of any COVID positive Test in last 90 days:  PCP - Marca Agreste, DO at [facility], [phone]  Cardiologist -   Chest x-ray -  EKG -04-15-2024   Stress Test -  ECHO -  Cardiac Cath -  CT Coronary Calcium score: 0 on 09-04-2023 in Epic ZIO monitor-   Pacemaker / ICD device [x]  No []  Yes   Spinal Cord Stimulator:[x]  No []  Yes       History of Sleep Apnea? [x]  No []  Yes   CPAP used?- [x]  No []  Yes    Medication on DOS:  Eye drops,   Patient has: [x]  NO Hx DM   []  Pre-DM   []  DM1  []   DM2 Does the patient monitor blood sugar?   [x]  N/A   []  No []  Yes   Blood Thinner / Instructions: Aspirin Instructions:  Activity level: Able to walk up 2 flights of stairs without becoming significantly short of breath or having chest pain?   []    Yes   []  No,  would have:  Patient can perform ADLs without assistance.  []   Yes    []  No   Comments:   Anesthesia review: no pertinent surgical or medical history.  Patient denies any S&S of respiratory illness or Covid - no shortness of breath, fever, cough or chest pain at PAT appointment.

## 2024-07-26 NOTE — Patient Instructions (Signed)
 SURGICAL WAITING ROOM VISITATION Patients having surgery or a procedure may have no more than 2 support people in the waiting area - these visitors may rotate in the visitor waiting room.   If the patient needs to stay at the hospital during part of their recovery, the visitor guidelines for inpatient rooms apply.  PRE-OP VISITATION  Pre-op nurse will coordinate an appropriate time for 1 support person to accompany the patient in pre-op.  This support person may not rotate.  This visitor will be contacted when the time is appropriate for the visitor to come back in the pre-op area.  Temporary Visitor Restrictions   Children ages 61 and under will not be able to visit patients in [facility] under most circumstances. Visitation is not restricted outside of hospitals unless noted otherwise in the [facility] and Location Specific Visitation Guidelines at :       [email or web address]  Visitors with respiratory illnesses are discouraged from visiting and should remain at home.  You are not required to quarantine at this time prior to your surgery. However, you must do this: Hand Hygiene often Do NOT share personal items Notify your provider if you are in close contact with someone who has COVID or you develop fever 100.4 or greater, new onset of sneezing, cough, sore throat, shortness of breath or body aches.  If you test positive for Covid or have been in contact with anyone that has tested positive in the last 10 days please notify you surgeon.    Your procedure is scheduled on:  Friday  08-06-2024  Report to [facility] Main Entrance: [place] entrance where the [employer] is available.   Report to admitting at: 05:15    AM  Call this number if you have any questions or problems the morning of surgery [phone]  FOLLOW ANY ADDITIONAL PRE OP INSTRUCTIONS YOU RECEIVED FROM YOUR SURGEON'S OFFICE!!!  Dulcolax 20 mg (total) - Take 4 (four) of the 5 mg Dulcolax tablets with  water at 07:00 am the day prior to surgery.  Miralax 238 g - Mix with 64 oz Gatorade/Powerade.  Starting at 10:00 am ,Drink this gradually over the next few hours (8 oz glass every 15-30 minutes) until gone the day prior to surgery You should finish in 4 hours-6 hours.    Neomycin 1000 mg - At 2 pm, 3 pm and 10 pm after Miralax  bowel prep the day prior to surgery.  Metronidazole 1000 mg - At 2 pm, 3 pm and 10 pm after Miralax bowel prep the day prior to surgery.   Ondansetron  (Zofran ) can be used if you have any nausea from the other bowel prep medications.  Drink plenty of clear liquids all evening to avoid getting dehydrated.   DRINK two (2) bottles of Pre-Surgery Clear Ensure drink starting at 6:00 pm the evening prior to your surgery to help prevent dehydration. Increase drinking clear fluids (see list below)          Do not eat food after Midnight the night prior to your surgery/procedure.  After Midnight you may have the following liquids until  04:30 AM DAY OF SURGERY  Clear Liquid Diet Water Black Coffee (sugar ok, NO MILK/CREAM OR CREAMERS)  Tea (sugar ok, NO MILK/CREAM OR CREAMERS) regular and decaf                             Plain Jell-O  with no fruit (NO  RED)                                           Fruit ices (not with fruit pulp, NO RED)                                     Popsicles (NO RED)                                                                  Juice: NO CITRUS JUICES: only apple, WHITE grape, WHITE cranberry Sports drinks like Gatorade or Powerade (NO RED)                   The day of surgery:  Drink ONE (1) Pre-Surgery Clear Ensure at   04:30   AM the morning of surgery. Drink in one sitting. Do not sip.  This drink was given to you during your hospital pre-op appointment visit. Nothing else to drink after completing the Pre-Surgery Clear Ensure  : No candy, chewing gum or throat lozenges.     Oral Hygiene is also important to reduce your risk of  infection.        Remember - BRUSH YOUR TEETH THE MORNING OF SURGERY WITH YOUR REGULAR TOOTHPASTE  Do NOT smoke after Midnight the night before surgery.  STOP TAKING all Vitamins, Herbs and supplements 1 week before your surgery.   Take ONLY these medicines the morning of surgery with A SIP OF WATER: Eye drops if needed.                    You may not have any metal on your body including hair pins, jewelry, and body piercing  Do not wear make-up, lotions, powders, perfumes, or deodorant  Do not wear nail polish including gel and S&S, artificial / acrylic nails, or any other type of covering on natural nails including finger and toenails. If you have artificial nails, gel coating, etc., that needs to be removed by a nail salon, Please have this removed prior to surgery. Not doing so may mean that your surgery could be cancelled or delayed if the Surgeon or anesthesia staff feels like they are unable to monitor you safely.   Do not shave 48 hours prior to surgery to avoid nicks in your skin which may contribute to postoperative infections.   Contacts, Hearing Aids, dentures or bridgework may not be worn into surgery. DENTURES WILL BE REMOVED PRIOR TO SURGERY PLEASE DO NOT APPLY Poly grip OR ADHESIVES!!!  You may bring a small overnight bag with you on the day of surgery, only pack items that are not valuable. [place] [facility] IS NOT RESPONSIBLE   FOR VALUABLES THAT ARE LOST OR STOLEN.   Do not bring your home medications to the hospital. The Pharmacy will dispense medications listed on your medication list to you during your admission in the Hospital.  Please read over the following fact sheets you were given: IF YOU HAVE QUESTIONS ABOUT YOUR PRE-OP INSTRUCTIONS, PLEASE CALL [phone]   [facility] - Preparing for Surgery  Before surgery, you can play an important role.  Because skin is not sterile, your skin needs to be as free of germs as possible.  You can reduce the number  of germs on your skin by washing with CHG (chlorahexidine gluconate) soap before surgery.  CHG is an antiseptic cleaner which kills germs and bonds with the skin to continue killing germs even after washing. Please DO NOT use if you have an allergy to CHG or antibacterial soaps.  If your skin becomes reddened/irritated stop using the CHG and inform your nurse when you arrive at Short Stay. Do not shave (including legs and underarms) for at least 48 hours prior to the first CHG shower.  You may shave your face/neck.  Please follow these instructions carefully:  1.  Shower with CHG Soap the night before surgery ONLY (DO NOT USE THE CHG SOAP THE MORNING OF SURGERY).  2.  If you choose to wash your hair, wash your hair first as usual with your normal  shampoo.  3.  After you shampoo, rinse your hair and body thoroughly to remove the shampoo.                             4.  Use CHG as you would any other liquid soap.  You can apply chg directly to the skin and wash.  Gently with a scrungie or clean washcloth.  5.  Apply the CHG Soap to your body ONLY FROM THE NECK DOWN.   Do not use on face/ open                           Wound or open sores. Avoid contact with eyes, ears mouth and genitals (private parts).                       Wash face,  Genitals (private parts) with your normal soap.             6.  Wash thoroughly, paying special attention to the area where your  surgery  will be performed.  7.  Thoroughly rinse your body with warm water from the neck down.  8.  DO NOT shower/wash with your normal soap after using and rinsing off the CHG Soap.                9.  Pat yourself dry with a clean towel.            10.  Wear clean pajamas.            11.  Place clean sheets on your bed the night of your first shower and do not  sleep with pets.  Day of Surgery : Do not apply any CHG, lotions/deodorants the morning of surgery.  Please wear clean clothes to the hospital/surgery center.   FAILURE TO  FOLLOW THESE INSTRUCTIONS MAY RESULT IN THE CANCELLATION OF YOUR SURGERY  PATIENT SIGNATURE_________________________________  NURSE SIGNATURE__________________________________  ________________________________________________________________________     Brenda Dyer    An incentive spirometer is a tool that can help keep your lungs clear and active. This tool measures how well you are filling your lungs with each breath. Taking long deep breaths may help reverse or decrease the chance of developing breathing (pulmonary) problems (especially infection) following: A long period of time when you are unable to move or be active. BEFORE THE PROCEDURE  If  the spirometer includes an indicator to show your best effort, your nurse or respiratory therapist will set it to a desired goal. If possible, sit up straight or lean slightly forward. Try not to slouch. Hold the incentive spirometer in an upright position. INSTRUCTIONS FOR USE  Sit on the edge of your bed if possible, or sit up as far as you can in bed or on a chair. Hold the incentive spirometer in an upright position. Breathe out normally. Place the mouthpiece in your mouth and seal your lips tightly around it. Breathe in slowly and as deeply as possible, raising the piston or the ball toward the top of the column. Hold your breath for 3-5 seconds or for as long as possible. Allow the piston or ball to fall to the bottom of the column. Remove the mouthpiece from your mouth and breathe out normally. Rest for a few seconds and repeat Steps 1 through 7 at least 10 times every 1-2 hours when you are awake. Take your time and take a few normal breaths between deep breaths. The spirometer may include an indicator to show your best effort. Use the indicator as a goal to work toward during each repetition. After each set of 10 deep breaths, practice coughing to be sure your lungs are clear. If you have an incision (the cut made at the  time of surgery), support your incision when coughing by placing a pillow or rolled up towels firmly against it. Once you are able to get out of bed, walk around indoors and cough well. You may stop using the incentive spirometer when instructed by your caregiver.  RISKS AND COMPLICATIONS Take your time so you do not get dizzy or light-headed. If you are in pain, you may need to take or ask for pain medication before doing incentive spirometry. It is harder to take a deep breath if you are having pain. AFTER USE Rest and breathe slowly and easily. It can be helpful to keep track of a log of your progress. Your caregiver can provide you with a simple table to help with this. If you are using the spirometer at home, follow these instructions: SEEK MEDICAL CARE IF:  You are having difficultly using the spirometer. You have trouble using the spirometer as often as instructed. Your pain medication is not giving enough relief while using the spirometer. You develop fever of 100.5 F (38.1 C) or higher.                                                                                                    SEEK IMMEDIATE MEDICAL CARE IF:  You cough up bloody sputum that had not been present before. You develop fever of 102 F (38.9 C) or greater. You develop worsening pain at or near the incision site. MAKE SURE YOU:  Understand these instructions. Will watch your condition. Will get help right away if you are not doing well or get worse. Document Released: 10/28/2006 Document Revised: 09/09/2011 Document Reviewed: 12/29/2006 [facility] Patient Information 2014 [place], [place].

## 2024-07-27 ENCOUNTER — Encounter (HOSPITAL_COMMUNITY)
Admission: RE | Admit: 2024-07-27 | Discharge: 2024-07-27 | Disposition: A | Discharge status: Home / Self Care | Source: Ambulatory Visit | Attending: General Surgery | Admitting: General Surgery

## 2024-07-27 ENCOUNTER — Encounter (HOSPITAL_COMMUNITY): Payer: Self-pay

## 2024-07-30 ENCOUNTER — Encounter (HOSPITAL_COMMUNITY)
Admission: RE | Admit: 2024-07-30 | Discharge: 2024-07-30 | Disposition: A | Source: Ambulatory Visit | Attending: General Surgery

## 2024-07-30 VITALS — BP 122/72 | HR 74 | Temp 98.3°F | Resp 16 | Ht 65.5 in | Wt 120.6 lb

## 2024-07-30 DIAGNOSIS — Z01812 Encounter for preprocedural laboratory examination: Secondary | ICD-10-CM | POA: Diagnosis present

## 2024-07-30 DIAGNOSIS — Z01818 Encounter for other preprocedural examination: Secondary | ICD-10-CM

## 2024-07-30 LAB — BASIC METABOLIC PANEL WITH GFR
Anion gap: 7 (ref 5–15)
BUN: 18 mg/dL (ref 8–23)
CO2: 29 mmol/L (ref 22–32)
Calcium: 9.8 mg/dL (ref 8.9–10.3)
Chloride: 101 mmol/L (ref 98–111)
Creatinine, Ser: 0.76 mg/dL (ref 0.44–1.00)
GFR, Estimated: >60 mL/min
Glucose, Bld: 103 mg/dL — ABNORMAL HIGH (ref 70–99)
Potassium: 3.9 mmol/L (ref 3.5–5.1)
Sodium: 137 mmol/L (ref 135–145)

## 2024-07-30 LAB — CBC
HCT: 38.8 % (ref 36.0–46.0)
Hemoglobin: 12.8 g/dL (ref 12.0–15.0)
MCH: 30.9 pg (ref 26.0–34.0)
MCHC: 33 g/dL (ref 30.0–36.0)
MCV: 93.7 fL (ref 80.0–100.0)
Platelets: 229 10*3/uL (ref 150–400)
RBC: 4.14 MIL/uL (ref 3.87–5.11)
RDW: 12.9 % (ref 11.5–15.5)
WBC: 6.1 10*3/uL (ref 4.0–10.5)
nRBC: 0 % (ref 0.0–0.2)

## 2024-08-02 ENCOUNTER — Encounter (HOSPITAL_COMMUNITY)

## 2024-08-05 ENCOUNTER — Encounter (HOSPITAL_COMMUNITY): Payer: Self-pay | Admitting: General Surgery

## 2024-08-05 NOTE — Anesthesia Preprocedure Evaluation (Signed)
 "                                  Anesthesia Evaluation  Patient identified by MRN, date of birth, ID band Patient awake    Reviewed: Allergy & Precautions, NPO status , Patient's Chart, lab work & pertinent test results  History of Anesthesia Complications Negative for: history of anesthetic complications  Airway Mallampati: II  TM Distance: >3 FB Neck ROM: Full    Dental no notable dental hx. (+) Teeth Intact, Dental Advisory Given   Pulmonary former smoker   Pulmonary exam normal breath sounds clear to auscultation       Cardiovascular Exercise Tolerance: Good Normal cardiovascular exam Rhythm:Regular Rate:Normal     Neuro/Psych negative neurological ROS  negative psych ROS   GI/Hepatic Abscess of sigmoid colon/diverticulitis)   Endo/Other  neg diabetes    Renal/GU Renal diseaseLab Results      Component                Value               Date                         K                        3.9                 07/30/2024                   CREATININE               0.76                07/30/2024                           Musculoskeletal  (+) Arthritis ,    Abdominal   Peds  Hematology Lab Results      Component                Value               Date                      WBC                      6.1                 07/30/2024                HGB                      12.8                07/30/2024                HCT                      38.8                07/30/2024                MCV  93.7                07/30/2024                PLT                      229                 07/30/2024              Anesthesia Other Findings All: codeine, linaclotide  Reproductive/Obstetrics                              Anesthesia Physical Anesthesia Plan  ASA: 3  Anesthesia Plan: General   Post-op Pain Management: Precedex, Tylenol  PO (pre-op)*, Lidocaine  infusion* and Ketamine  IV*   Induction:  Intravenous  PONV Risk Score and Plan: 3 and Midazolam , Dexamethasone , Ondansetron  and Treatment may vary due to age or medical condition  Airway Management Planned: Oral ETT  Additional Equipment: None  Intra-op Plan:   Post-operative Plan: Extubation in OR  Informed Consent: I have reviewed the patients History and Physical, chart, labs and discussed the procedure including the risks, benefits and alternatives for the proposed anesthesia with the patient or authorized representative who has indicated his/her understanding and acceptance.     Dental advisory given  Plan Discussed with: CRNA and Surgeon  Anesthesia Plan Comments:          Anesthesia Quick Evaluation  "

## 2024-08-06 ENCOUNTER — Encounter (HOSPITAL_COMMUNITY): Payer: Self-pay | Admitting: Certified Registered Nurse Anesthetist

## 2024-08-06 ENCOUNTER — Inpatient Hospital Stay (HOSPITAL_COMMUNITY): Admission: RE | Admit: 2024-08-06 | Source: Ambulatory Visit | Admitting: General Surgery

## 2024-08-06 ENCOUNTER — Encounter (HOSPITAL_COMMUNITY): Payer: Self-pay | Admitting: General Surgery

## 2024-08-06 ENCOUNTER — Encounter (HOSPITAL_COMMUNITY): Admission: RE | Payer: Self-pay | Source: Ambulatory Visit

## 2024-08-06 ENCOUNTER — Inpatient Hospital Stay (HOSPITAL_COMMUNITY): Payer: Self-pay | Admitting: Medical

## 2024-08-06 ENCOUNTER — Other Ambulatory Visit: Payer: Self-pay

## 2024-08-06 DIAGNOSIS — K56699 Other intestinal obstruction unspecified as to partial versus complete obstruction: Principal | ICD-10-CM | POA: Diagnosis present

## 2024-08-06 LAB — TYPE AND SCREEN
ABO/RH(D): A POS
Antibody Screen: NEGATIVE

## 2024-08-06 LAB — ABO/RH: ABO/RH(D): A POS

## 2024-08-06 MED ORDER — POLYETHYLENE GLYCOL 3350 17 GM/SCOOP PO POWD: 238.0000 g | Freq: Once | ORAL | Status: DC

## 2024-08-06 MED ORDER — ENSURE PRE-SURGERY PO LIQD
592.0000 mL | Freq: Once | ORAL | Status: DC
  Filled 2024-08-06: qty 592

## 2024-08-06 MED ORDER — ALVIMOPAN 12 MG PO CAPS
12.0000 mg | ORAL_CAPSULE | ORAL | Status: AC
  Administered 2024-08-06: 12 mg via ORAL
  Filled 2024-08-06: qty 1

## 2024-08-06 MED ORDER — RINGERS IRRIGATION IR SOLN
Status: DC | PRN
  Administered 2024-08-06: 1

## 2024-08-06 MED ORDER — ONDANSETRON HCL 4 MG PO TABS: 4.0000 mg | ORAL_TABLET | Freq: Four times a day (QID) | ORAL | Status: AC | PRN

## 2024-08-06 MED ORDER — KCL IN DEXTROSE-NACL 20-5-0.45 MEQ/L-%-% IV SOLN
INTRAVENOUS | Status: AC
  Filled 2024-08-06: qty 1000

## 2024-08-06 MED ORDER — HYDROMORPHONE HCL 1 MG/ML IJ SOLN
0.2500 mg | INTRAMUSCULAR | Status: DC | PRN
  Administered 2024-08-06 (×4): 0.5 mg via INTRAVENOUS

## 2024-08-06 MED ORDER — HEPARIN SODIUM (PORCINE) 5000 UNIT/ML IJ SOLN
5000.0000 [IU] | Freq: Once | INTRAMUSCULAR | Status: AC
  Administered 2024-08-06: 5000 [IU] via SUBCUTANEOUS
  Filled 2024-08-06: qty 1

## 2024-08-06 MED ORDER — KCL IN DEXTROSE-NACL 20-5-0.45 MEQ/L-%-% IV SOLN: INTRAVENOUS | Status: AC

## 2024-08-06 MED ORDER — DEXAMETHASONE SOD PHOSPHATE PF 10 MG/ML IJ SOLN
INTRAMUSCULAR | Status: DC | PRN
  Administered 2024-08-06: 10 mg via INTRAVENOUS

## 2024-08-06 MED ORDER — SACCHAROMYCES BOULARDII 250 MG PO CAPS
250.0000 mg | ORAL_CAPSULE | Freq: Two times a day (BID) | ORAL | Status: AC
  Administered 2024-08-06: 250 mg via ORAL
  Filled 2024-08-06: qty 1

## 2024-08-06 MED ORDER — LIDOCAINE HCL 1 % IJ SOLN
INTRAMUSCULAR | Status: AC
  Filled 2024-08-06: qty 20

## 2024-08-06 MED ORDER — GABAPENTIN 300 MG PO CAPS
300.0000 mg | ORAL_CAPSULE | ORAL | Status: AC
  Administered 2024-08-06: 300 mg via ORAL
  Filled 2024-08-06: qty 1

## 2024-08-06 MED ORDER — TRAMADOL HCL 50 MG PO TABS: 50.0000 mg | ORAL_TABLET | Freq: Four times a day (QID) | ORAL | Status: AC | PRN

## 2024-08-06 MED ORDER — PHENYLEPHRINE HCL-NACL 20-0.9 MG/250ML-% IV SOLN
INTRAVENOUS | Status: DC | PRN
  Administered 2024-08-06: 10 ug/min via INTRAVENOUS

## 2024-08-06 MED ORDER — SIMETHICONE 80 MG PO CHEW: 40.0000 mg | CHEWABLE_TABLET | Freq: Four times a day (QID) | ORAL | Status: AC | PRN

## 2024-08-06 MED ORDER — KETOROLAC TROMETHAMINE 30 MG/ML IJ SOLN: 15.0000 mg | Freq: Once | INTRAMUSCULAR | Status: DC | PRN

## 2024-08-06 MED ORDER — PROPOFOL 10 MG/ML IV BOLUS
INTRAVENOUS | Status: AC
  Filled 2024-08-06: qty 20

## 2024-08-06 MED ORDER — PROPOFOL 10 MG/ML IV BOLUS
INTRAVENOUS | Status: DC | PRN
  Administered 2024-08-06: 100 mg via INTRAVENOUS

## 2024-08-06 MED ORDER — ONDANSETRON HCL 4 MG/2ML IJ SOLN
INTRAMUSCULAR | Status: DC | PRN
  Administered 2024-08-06: 4 mg via INTRAVENOUS

## 2024-08-06 MED ORDER — BUPIVACAINE-EPINEPHRINE (PF) 0.25% -1:200000 IJ SOLN
INTRAMUSCULAR | Status: AC
  Filled 2024-08-06: qty 60

## 2024-08-06 MED ORDER — GABAPENTIN 100 MG PO CAPS
300.0000 mg | ORAL_CAPSULE | Freq: Two times a day (BID) | ORAL | Status: AC
  Administered 2024-08-06: 300 mg via ORAL
  Filled 2024-08-06: qty 3

## 2024-08-06 MED ORDER — POLYVINYL ALCOHOL 1.4 % OP SOLN: 1.0000 [drp] | Freq: Three times a day (TID) | OPHTHALMIC | Status: AC | PRN

## 2024-08-06 MED ORDER — PHENYLEPHRINE 80 MCG/ML (10ML) SYRINGE FOR IV PUSH (FOR BLOOD PRESSURE SUPPORT)
PREFILLED_SYRINGE | INTRAVENOUS | Status: DC | PRN
  Administered 2024-08-06: 120 ug via INTRAVENOUS

## 2024-08-06 MED ORDER — DIPHENHYDRAMINE HCL 50 MG/ML IJ SOLN: 12.5000 mg | Freq: Four times a day (QID) | INTRAMUSCULAR | Status: AC | PRN

## 2024-08-06 MED ORDER — ROCURONIUM BROMIDE 10 MG/ML (PF) SYRINGE
PREFILLED_SYRINGE | INTRAVENOUS | Status: DC | PRN
  Administered 2024-08-06: 40 mg via INTRAVENOUS
  Administered 2024-08-06 (×2): 30 mg via INTRAVENOUS

## 2024-08-06 MED ORDER — 0.9 % SODIUM CHLORIDE (POUR BTL) OPTIME
TOPICAL | Status: DC | PRN
  Administered 2024-08-06 (×2): 1000 mL

## 2024-08-06 MED ORDER — HYDROMORPHONE HCL 1 MG/ML IJ SOLN
INTRAMUSCULAR | Status: AC
  Filled 2024-08-06: qty 1

## 2024-08-06 MED ORDER — KETAMINE HCL 50 MG/5ML IJ SOSY
PREFILLED_SYRINGE | INTRAMUSCULAR | Status: AC
  Filled 2024-08-06: qty 5

## 2024-08-06 MED ORDER — BISACODYL 5 MG PO TBEC: 20.0000 mg | DELAYED_RELEASE_TABLET | Freq: Once | ORAL | Status: DC

## 2024-08-06 MED ORDER — ENSURE PRE-SURGERY PO LIQD
296.0000 mL | Freq: Once | ORAL | Status: DC
  Filled 2024-08-06: qty 296

## 2024-08-06 MED ORDER — ONDANSETRON HCL 4 MG/2ML IJ SOLN: 4.0000 mg | Freq: Once | INTRAMUSCULAR | Status: DC | PRN

## 2024-08-06 MED ORDER — ALVIMOPAN 12 MG PO CAPS: 12.0000 mg | ORAL_CAPSULE | Freq: Two times a day (BID) | ORAL | Status: AC

## 2024-08-06 MED ORDER — FENTANYL CITRATE (PF) 100 MCG/2ML IJ SOLN
INTRAMUSCULAR | Status: DC | PRN
  Administered 2024-08-06 (×2): 50 ug via INTRAVENOUS

## 2024-08-06 MED ORDER — SUGAMMADEX SODIUM 200 MG/2ML IV SOLN
INTRAVENOUS | Status: DC | PRN
  Administered 2024-08-06: 200 mg via INTRAVENOUS

## 2024-08-06 MED ORDER — SODIUM CHLORIDE 0.9 % IV SOLN
2.0000 g | INTRAVENOUS | Status: AC
  Administered 2024-08-06: 2 g via INTRAVENOUS
  Filled 2024-08-06: qty 2

## 2024-08-06 MED ORDER — OXYCODONE HCL 5 MG PO TABS: 5.0000 mg | ORAL_TABLET | Freq: Once | ORAL | Status: DC | PRN

## 2024-08-06 MED ORDER — PHENYLEPHRINE HCL-NACL 20-0.9 MG/250ML-% IV SOLN
INTRAVENOUS | Status: AC
  Filled 2024-08-06: qty 250

## 2024-08-06 MED ORDER — HYDROMORPHONE HCL 1 MG/ML IJ SOLN
0.5000 mg | INTRAMUSCULAR | Status: AC | PRN
  Administered 2024-08-06 (×3): 0.5 mg via INTRAVENOUS
  Filled 2024-08-06 (×2): qty 0.5

## 2024-08-06 MED ORDER — OXYCODONE HCL 5 MG/5ML PO SOLN: 5.0000 mg | Freq: Once | ORAL | Status: DC | PRN

## 2024-08-06 MED ORDER — KETAMINE HCL 50 MG/5ML IJ SOSY
PREFILLED_SYRINGE | INTRAMUSCULAR | Status: DC | PRN
  Administered 2024-08-06 (×2): 10 mg via INTRAVENOUS

## 2024-08-06 MED ORDER — LIDOCAINE HCL (PF) 2 % IJ SOLN
INTRAMUSCULAR | Status: DC | PRN
  Administered 2024-08-06: 80 mg via INTRADERMAL
  Administered 2024-08-06: 1.5 mg/kg/h via INTRADERMAL

## 2024-08-06 MED ORDER — ENSURE SURGERY PO LIQD
237.0000 mL | Freq: Two times a day (BID) | ORAL | Status: AC
  Administered 2024-08-06: 237 mL via ORAL

## 2024-08-06 MED ORDER — LACTATED RINGERS IV SOLN: INTRAVENOUS | Status: DC | PRN

## 2024-08-06 MED ORDER — ACETAMINOPHEN 10 MG/ML IV SOLN
650.0000 mg | Freq: Once | INTRAVENOUS | Status: AC
  Administered 2024-08-06: 650 mg via INTRAVENOUS

## 2024-08-06 MED ORDER — FENTANYL CITRATE (PF) 100 MCG/2ML IJ SOLN
INTRAMUSCULAR | Status: AC
  Filled 2024-08-06: qty 2

## 2024-08-06 MED ORDER — ENOXAPARIN SODIUM 40 MG/0.4ML IJ SOSY: 40.0000 mg | PREFILLED_SYRINGE | INTRAMUSCULAR | Status: AC

## 2024-08-06 MED ORDER — ACETAMINOPHEN 500 MG PO TABS
1000.0000 mg | ORAL_TABLET | ORAL | Status: AC
  Administered 2024-08-06: 1000 mg via ORAL
  Filled 2024-08-06: qty 2

## 2024-08-06 MED ORDER — BUPIVACAINE-EPINEPHRINE 0.25% -1:200000 IJ SOLN
INTRAMUSCULAR | Status: DC | PRN
  Administered 2024-08-06: 40 mL

## 2024-08-06 MED ORDER — MIDAZOLAM HCL 2 MG/2ML IJ SOLN
INTRAMUSCULAR | Status: AC
  Filled 2024-08-06: qty 2

## 2024-08-06 MED ORDER — MIDAZOLAM HCL 5 MG/5ML IJ SOLN
INTRAMUSCULAR | Status: DC | PRN
  Administered 2024-08-06: 2 mg via INTRAVENOUS

## 2024-08-06 MED ORDER — DIPHENHYDRAMINE HCL 12.5 MG/5ML PO ELIX: 12.5000 mg | ORAL_SOLUTION | Freq: Four times a day (QID) | ORAL | Status: AC | PRN

## 2024-08-06 MED ORDER — ONDANSETRON HCL 4 MG/2ML IJ SOLN
4.0000 mg | Freq: Four times a day (QID) | INTRAMUSCULAR | Status: AC | PRN
  Administered 2024-08-06 (×2): 4 mg via INTRAVENOUS
  Filled 2024-08-06 (×2): qty 2

## 2024-08-06 MED ORDER — ALUM & MAG HYDROXIDE-SIMETH 200-200-20 MG/5ML PO SUSP: 30.0000 mL | Freq: Four times a day (QID) | ORAL | Status: AC | PRN

## 2024-08-06 NOTE — Op Note (Signed)
 08/06/2024  9:43 AM  PATIENT:  Brenda Dyer Mar  69 y.o. female  Patient Care Team: Chet Mad, DO as PCP - General (Family Medicine)  PRE-OPERATIVE DIAGNOSIS:  RECURRENT DIVERTICULITIS  POST-OPERATIVE DIAGNOSIS:  RECURRENT DIVERTICULITIS   PROCEDURE:  LOW ANTERIOR RESECTION, ROBOT-ASSISTED    Surgeon(s): Debby Hila, MD Teresa Lonni HERO, MD  ASSISTANT: Teresa   ANESTHESIA:   local and general  EBL: 20ml Total I/O In: 100 [IV Piggyback:100] Out: 15 [Urine:15]  Delay start of Pharmacological VTE agent (>24hrs) due to surgical blood loss or risk of bleeding:  no  DRAINS: none   SPECIMEN:  Source of Specimen:  Rectosigmoid  DISPOSITION OF SPECIMEN:  PATHOLOGY  COUNTS:  YES  PLAN OF CARE: Admit to inpatient   PATIENT DISPOSITION:  PACU - hemodynamically stable.  INDICATION:    69 y.o. F with recurrent diverticulitis.  I recommended resection:  The anatomy & physiology of the digestive tract was discussed.  The pathophysiology was discussed.  Natural history risks without surgery was discussed.   I worked to give an overview of the disease and the frequent need to have multispecialty involvement.  I feel the risks of no intervention will lead to serious problems that outweigh the operative risks; therefore, I recommended a partial colectomy to remove the pathology.  Laparoscopic & open techniques were discussed.   Risks such as bleeding, infection, abscess, leak, reoperation, possible ostomy, hernia, heart attack, death, and other risks were discussed.  I noted a good likelihood this will help address the problem.   Goals of post-operative recovery were discussed as well.    The patient expressed understanding & wished to proceed with surgery.  OR FINDINGS:   Patient had a distal sigmoid stricture with evidence of previous diverticulitis proximal to the this   The anastomosis rests 10 cm from the anal verge by rigid proctoscopy.  DESCRIPTION:    Informed consent was confirmed.  The patient underwent general anaesthesia without difficulty.  The patient was positioned appropriately.  VTE prevention in place.  The patient's abdomen was clipped, prepped, & draped in a sterile fashion.  Surgical timeout confirmed our plan.  The patient was positioned in reverse Trendelenburg.  Abdominal entry was gained using a Varies needle.  Entry was clean.  I induced carbon dioxide insufflation.  An 8mm robotic port was placed in the RUQ.  Camera inspection revealed no injury.  Extra ports were carefully placed under direct laparoscopic visualization.  I laparoscopically reflected the greater omentum and the upper abdomen the small bowel in the upper abdomen. The patient was appropriately positioned and the robot was docked to the patient's left side.  Instruments were placed under direct visualization.    I mobilized the sigmoid colon off of the pelvic sidewall. I scored the base of peritoneum of the right side of the mesentery of the left colon from the ligament of Treitz to the peritoneal reflection of the mid rectum.  The patient had a distal sigmoid stricture with evidence of previous diverticulitis proximal to the this.  I elevated the sigmoid mesentery and enetered into the retro-mesenteric plane. We were able to identify the left ureter and gonadal vessels. We kept those posterior within the retroperitoneum and elevated the left colon mesentery off that. I did isolated IMA pedicle but did not ligate it yet.  I continued distally and got into the avascular plane posterior to the mesorectum. This allowed me to help mobilize the rectum as well by freeing the mesorectum off the  sacrum.  I mobilized the peritoneal coverings towards the peritoneal reflection on both the right and left sides of the rectum.  I could see the right and left ureters and stayed away from them.    I skeletonized the inferior mesenteric artery pedicle.  After confirming the left ureter  was out of the way, I went ahead and ligated the inferior mesenteric artery pedicle with bipolar robotic vessel sealer well above its takeoff from the aorta.  We ensured hemostasis. I skeletonized the mesorectum at the junction at the proximal rectum using blunt dissection & bipolar robotic vessel sealer.  I mobilized the left colon in a lateral to medial fashion off the line of Toldt up towards the splenic flexure to ensure good mobilization of the left colon to reach into the pelvis.  I divided the remaining mesentery up to the level of the descending and sigmoid colon junction.  Perfusion of the remaining colon was good.  The rectosigmoid was transected using 2 loads of the 60mm green load stapler.  The robot was undocked and the suprapubic 12mm port was enlarged into a Pfannenstiel incision and the Alexis wound protector was placed.  The colon was removed and transected proximally over a purse-stringer device.  A 2-0 prolene was placed.  This was secured with 3-0 silk sutures.  A 29mm anvil was placed and the purse string was tied tightly around this.  This was placed into the abdomen and a colorectal end to side anastomosis was completed under direct laparoscopic visualization.  This was tested under irrigation with insufflation with a flexible sigmoidoscope.  There was no staple line bleeding.  The anastomosis was noted to be about 10cm from the anal verge.  The scope was removed and we switched to clean gowns, gloves, drapes and instruments.    The extraction port site was closed using a 0 Vicryl running suture.  The fascia was closed using 2 #1 vicryl sutures in a running fashion.  The subcuticular layer was closed using a running 2-0 Vicryl suture.  The skin of the incision and port sites were closed using 4-0 running subcuticular Vicryl sutures.  The patient was then awakened from anesthesia and sent to the PACU in stable condition.  All counts were correct per OR staff.     An MD assistant was  necessary for tissue manipulation, retraction and positioning due to the complexity of the case and hospital policies   Brenda JAYSON Ned, MD  Colorectal and General Surgery [facility] Surgery

## 2024-08-06 NOTE — H&P (Signed)
 "   REFERRING PHYSICIAN:  Shila Gustav GAILS, MD   PROVIDER:  BERNARDA WANDA NED, MD   MRN: [id number] DOB: Oct 21, 1955 DATE OF ENCOUNTER: 06/14/2024   Subjective    Chief Complaint: New Consultation ( NEW PATIENT - Abscess of sigmoid colon/diverticulitis)       History of Present Illness: Brenda Dyer is a 69 y.o. female who is seen today as an office consultation at the request of Dr. Nandigam for evaluation of New Consultation ( NEW PATIENT - Abscess of sigmoid colon/diverticulitis) .   69 year old female who developed some left lower quadrant pain in April 2025.  She underwent a CT which showed subacute diverticulitis of the proximal sigmoid colon.  This resolved with antibiotics.  She then developed new symptoms in early October 2025 while on vacation.  This subsequently required a hospitalization for severe diverticulitis with possible colonic wall abscess.  This resolved with IV antibiotics.  She has now recovered from this and is using MiraLAX  daily to keep her bowel habits regular.  She does endorse a longstanding history of constipation that has worsened over the last few years.   Review of Systems: A complete review of systems was obtained from the patient.  I have reviewed this information and discussed as appropriate with the patient.  See HPI as well for other ROS.       Medical History:     Past Medical History:  Diagnosis Date   Abscess of sigmoid colon due to diverticulitis     Arthritis     Diverticulosis     Genital warts     Glaucoma (increased eye pressure)     Kidney stones     Osteopenia     Osteoporosis     Scoliosis        There is no problem list on file for this patient.          Past Surgical History:  Procedure Laterality Date   .Pelvic laparoscopy       CESAREAN SECTION       Gynecologic cryosurgery        Mandible surgery (Bilateral)               Allergies  Allergen Reactions   Linaclotide Other (See Comments) and  Swelling      Leg swelling   Codeine Nausea            Current Outpatient Medications on File Prior to Visit  Medication Sig Dispense Refill   cetirizine (ZYRTEC) 10 MG tablet 1 tablet Orally Once a day As needed       cholecalciferol (VITAMIN D3) 1000 unit tablet 1 tablet Orally Once a day       diclofenac  (VOLTAREN ) 1 % topical gel Apply 2 g Externally Four times a day As needed       guaiFENesin 1,200 mg Ta12 Take by mouth       ibuprofen  200 mg Cap Take 600 mg by mouth every 6 (six) hours       magnesium citrate 125 mg Cap 150 mg 1 capsule Orally Once a day       metroNIDAZOLE (METROCREAM) 0.75 % cream Apply 1 Application topically 2 (two) times daily as needed       predniSONE  (DELTASONE ) 10 mg tablet pack 10mg  Tabs, 6 day taper. Use as directed       triamcinolone  0.1 % cream Apply 1 Application topically 2 (two) times daily as needed  No current facility-administered medications on file prior to visit.           Family History  Problem Relation Age of Onset   Colonic polyp Mother     Colon polyps Mother     Stroke Mother     Myocardial Infarction (Heart attack) Father     Diabetes Father     Stroke Father     Hyperlipidemia (Elevated cholesterol) Brother     Esophageal cancer Neg Hx     Stomach cancer Neg Hx     Rectal cancer Neg Hx     Breast cancer Neg Hx     Colon cancer Neg Hx        Social History       Tobacco Use  Smoking Status Former  Smokeless Tobacco Never      Social History         Socioeconomic History   Marital status: Married  Tobacco Use   Smoking status: Former   Smokeless tobacco: Never  Substance and Sexual Activity   Alcohol  use: Never   Drug use: Never   Sexual activity: Yes      Partners: Male    Social Drivers of Health        Food Insecurity: No Food Insecurity (04/16/2024)    Received from [facility]    Hunger Vital Sign     Within the past 12 months, you worried that your food would run out before you got the  money to buy more.: Never true     Within the past 12 months, the food you bought just didn't last and you didn't have money to get more.: Never true  Transportation Needs: No Transportation Needs (04/16/2024)    Received from [facility]     In the past 12 months, has lack of transportation kept you from medical appointments or from getting medications?: No     In the past 12 months, has lack of transportation kept you from meetings, work, or from getting things needed for daily living?: No  Social Connections: Moderately Integrated (04/15/2024)    Received from [facility]    Social Connection and Isolation Panel     In a typical week, how many times do you talk on the phone with family, friends, or neighbors?: More than three times a week     How often do you get together with friends or relatives?: Never     How often do you attend church or religious services?: Never     Do you belong to any clubs or organizations such as church groups, unions, fraternal or athletic groups, or school groups?: Yes     How often do you attend meetings of the clubs or organizations you belong to?: Never     Are you married, widowed, divorced, separated, never married, or living with a partner?: Married  Housing Stability: Unknown (06/14/2024)    Housing Stability Vital Sign     Homeless in the Last Year: No      Objective:    Vitals:   08/06/24 0534  BP: 127/87  Pulse: 98  Resp: 16  Temp: 98.4 F (36.9 C)  SpO2: 99%      Exam Gen: NAD CV: RRR Pulm: CTA Abd: soft     Labs, Imaging and Diagnostic Testing: CT scans from April and September 2025 reviewed.  Patient appears to have significant inflammation of the proximate sigmoid colon.   Assessment and Plan:  Diverticulitis of large intestine with perforation and abscess without bleeding  (primary encounter diagnosis).  We discussed that today that due to the severity of her diverticulitis and the recurrent  nature, she would be a candidate for sigmoidectomy.  We discussed the surgery in detail today and discussed that this is an elective procedure to prophylactically reduce her risk of recurrence.  We did discuss that there is still approximately a 5% chance of recurrence even with the surgery.  We discussed the typical recovery process.  She would like to proceed. The surgery and anatomy were described to the patient as well as the risks of surgery and the possible complications.  These include: Bleeding, deep abdominal infections and possible wound complications such as hernia and infection, damage to adjacent structures, leak of surgical connections, which can lead to other surgeries and possibly an ostomy, possible need for other procedures, such as abscess drains in radiology, possible prolonged hospital stay, possible diarrhea from removal of part of the colon, possible constipation from narcotics, possible bowel, bladder or sexual dysfunction if having rectal surgery, prolonged fatigue/weakness or appetite loss, possible early recurrence of of disease, possible complications of their medical problems such as heart disease or arrhythmias or lung problems, death (less than 1%). I believe the patient understands and wishes to proceed with the surgery.        [name], MD Colon and Rectal Surgery [facility]    "

## 2024-08-06 NOTE — Transfer of Care (Signed)
 Immediate Anesthesia Transfer of Care Note  Patient: Brenda Dyer  Procedure(s) Performed: RESECTION, RECTUM, LOW ANTERIOR, ROBOT-ASSISTED (Abdomen)  Patient Location: PACU  Anesthesia Type:General  Level of Consciousness: awake, alert , and oriented  Airway & Oxygen Therapy: Patient Spontanous Breathing, Patient connected to face mask oxygen, and Patient connected to face mask  Post-op Assessment: Report given to RN, Post -op Vital signs reviewed and stable, and Patient moving all extremities  Post vital signs: Reviewed and stable  Last Vitals:  Vitals Value Taken Time  BP 128/72 08/06/24 09:56  Temp    Pulse 78 08/06/24 09:58  Resp 18 08/06/24 09:58  SpO2 100 % 08/06/24 09:58  Vitals shown include unfiled device data.  Last Pain:  Vitals:   08/06/24 0552  TempSrc:   PainSc: 0-No pain         Complications: There were no known notable events for this encounter.

## 2024-08-06 NOTE — Discharge Instructions (Signed)

## 2024-08-06 NOTE — Plan of Care (Signed)
  Problem: Education: Goal: Understanding of discharge needs will improve Outcome: Progressing Goal: Verbalization of understanding of the causes of altered bowel function will improve Outcome: Progressing   Problem: Activity: Goal: Ability to tolerate increased activity will improve Outcome: Progressing   Problem: Bowel/Gastric: Goal: Gastrointestinal status for postoperative course will improve Outcome: Progressing

## 2024-08-06 NOTE — Anesthesia Procedure Notes (Signed)
 Procedure Name: Intubation Date/Time: 08/06/2024 7:45 AM  Performed by: Franchot Delon RAMAN, CRNAPre-anesthesia Checklist: Patient identified, Emergency Drugs available, Suction available, Patient being monitored and Timeout performed Patient Re-evaluated:Patient Re-evaluated prior to induction Oxygen Delivery Method: Circle System Utilized Preoxygenation: Pre-oxygenation with 100% oxygen Induction Type: IV induction Ventilation: Mask ventilation without difficulty Laryngoscope Size: Miller and 2 Grade View: Grade I Tube type: Oral Tube size: 7.0 mm Number of attempts: 1 Airway Equipment and Method: Stylet and Oral airway Placement Confirmation: ETT inserted through vocal cords under direct vision, positive ETCO2 and breath sounds checked- equal and bilateral Secured at: 19 cm Tube secured with: Tape Dental Injury: Teeth and Oropharynx as per pre-operative assessment

## 2024-08-06 NOTE — Anesthesia Postprocedure Evaluation (Signed)
"   Anesthesia Post Note  Patient: Brenda Dyer  Procedure(s) Performed: RESECTION, RECTUM, LOW ANTERIOR, ROBOT-ASSISTED (Abdomen)     Patient location during evaluation: PACU Anesthesia Type: General Level of consciousness: awake and alert Pain management: pain level controlled Vital Signs Assessment: post-procedure vital signs reviewed and stable Respiratory status: spontaneous breathing, nonlabored ventilation, respiratory function stable and patient connected to nasal cannula oxygen Cardiovascular status: blood pressure returned to baseline and stable Postop Assessment: no apparent nausea or vomiting Anesthetic complications: no   There were no known notable events for this encounter.  Last Vitals:  Vitals:   08/06/24 1215 08/06/24 1230  BP: 95/65 97/61  Pulse: 69 74  Resp: 13 18  Temp:    SpO2: 100% 100%    Last Pain:  Vitals:   08/06/24 1130  TempSrc:   PainSc: Asleep                 Garnette DELENA Gab      "

## 2024-08-17 ENCOUNTER — Ambulatory Visit: Admitting: Obstetrics and Gynecology

## 2025-03-01 ENCOUNTER — Ambulatory Visit: Admitting: Obstetrics and Gynecology
